# Patient Record
Sex: Female | Born: 1990 | Race: White | Hispanic: No | Marital: Single | Smoking: Never smoker
Health system: Southern US, Community
[De-identification: ages and names within clinical notes are randomized; demographics above are authoritative.]

## PROBLEM LIST (undated history)

## (undated) DIAGNOSIS — D259 Leiomyoma of uterus, unspecified: Secondary | ICD-10-CM

## (undated) DIAGNOSIS — N946 Dysmenorrhea, unspecified: Secondary | ICD-10-CM

## (undated) DIAGNOSIS — F419 Anxiety disorder, unspecified: Secondary | ICD-10-CM

## (undated) DIAGNOSIS — N939 Abnormal uterine and vaginal bleeding, unspecified: Secondary | ICD-10-CM

## (undated) HISTORY — DX: Anxiety disorder, unspecified: F41.9

## (undated) HISTORY — DX: Dysmenorrhea, unspecified: N94.6

---
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
## 2011-02-02 NOTE — Assessment & Plan Note (Signed)
NAME:  Kelli Hoffman, EXTON NO.:  [id number]   MEDICAL RECORD NO.:  [id number]          PATIENT TYPE:  POB   LOCATION:  [facility]         FACILITY:  [facility]   PHYSICIAN:  Argentina Donovan, MD        DATE OF BIRTH:  1991-08-03   DATE OF SERVICE:  07/10/2008                                  CLINIC NOTE   The patient is a 20 year old virginal Caucasian female who came in with  her mother because of severe dysmenorrhea, periods are slightly  irregular from 20-32 days and on her heaviest day, she uses sometimes up  to 10 pads.  She has had dysmenorrhea since the onset of her periods at  age 17, but has increased significantly over the past year.  The  description of her periods seem to be crampy rather than steady pain and  seemed to be indicated a non-pathological dysmenorrhea.  The patient is  5 feet 5 inches tall, weighs 113 pounds.  She has normal blood pressure  130/70 and her pulse is 106.  We talked to the patient about  alternatives.  We have decided we can put her on Seasonique and get an  ultrasound to rule out any significant pathology.   IMPRESSION:  Dysmenorrhea.            ______________________________  Argentina Donovan, MD     PR/MEDQ  D:  07/10/2008  T:  07/10/2008  Job:  [id number]

## 2011-04-06 ENCOUNTER — Ambulatory Visit (INDEPENDENT_AMBULATORY_CARE_PROVIDER_SITE_OTHER): Payer: BC Managed Care – PPO | Admitting: Family Medicine

## 2011-04-06 ENCOUNTER — Encounter: Payer: Self-pay | Admitting: Family Medicine

## 2011-04-06 VITALS — BP 100/80 | HR 80 | Temp 98.2°F | Wt 119.0 lb

## 2011-04-06 DIAGNOSIS — L239 Allergic contact dermatitis, unspecified cause: Secondary | ICD-10-CM | POA: Insufficient documentation

## 2011-04-06 DIAGNOSIS — L259 Unspecified contact dermatitis, unspecified cause: Secondary | ICD-10-CM

## 2011-04-06 MED ORDER — FLUOCINONIDE 0.05 % EX CREA: TOPICAL_CREAM | CUTANEOUS | Status: DC

## 2011-04-06 NOTE — Progress Notes (Signed)
  Subjective:    Patient ID: Kelli Hoffman, female    DOB: Aug 18, 1991, 20 y.o.   MRN: [id number]  HPI Rash Patient presents for evaluation of a rash involving the trunk. Rash started 5 days ago. Lesions are pink, and raised in texture. Rash has changed over time. Rash is pruritic. Associated symptoms: none. Patient denies: abdominal pain, arthralgia, cough, decrease in appetite, fever, headache, myalgia, sore throat and vomiting. Patient has not had contacts with similar rash. Patient has not had new exposures (soaps, lotions, laundry detergents, foods, medications, plants, insects or animals).  Has tried OTC 1% hydrocortisone cream with no relief of symptoms.  Review of Systems See HPI    Objective:   Physical Exam BP 100/80  Pulse 80  Temp(Src) 98.2 F (36.8 C) (Oral)  Wt 119 lb (53.978 kg)  LMP 03/17/2011 Gen:  Alert, pleasant, NAD Skin:  Raised, erythematous, confluent rash on abdomen and back.       Assessment & Plan:   1. Allergic dermatitis   New. Pt unaware of any changes in soaps and or detergents. She will ask her mom if there have been any changes. Lidex to area, benadryl or zyrtec twice daily The patient indicates understanding of these issues and agrees with the plan.

## 2011-04-06 NOTE — Patient Instructions (Signed)
Good to meet you. Please ask your mom if she is using new detergents or soaps in the house.  Take Benadryl or Zyrtec twice daily (as written on bottle). Lidex cream once or twice daily. Call us at end of the week with an update.

## 2011-06-02 ENCOUNTER — Other Ambulatory Visit: Payer: Self-pay | Admitting: Obstetrics and Gynecology

## 2011-06-27 ENCOUNTER — Other Ambulatory Visit: Payer: Self-pay | Admitting: Obstetrics and Gynecology

## 2011-08-10 ENCOUNTER — Encounter: Payer: Self-pay | Admitting: Family Medicine

## 2011-08-10 ENCOUNTER — Ambulatory Visit (INDEPENDENT_AMBULATORY_CARE_PROVIDER_SITE_OTHER): Payer: BC Managed Care – PPO | Admitting: Family Medicine

## 2011-08-10 VITALS — BP 116/78 | HR 84 | Temp 98.6°F | Wt 119.2 lb

## 2011-08-10 DIAGNOSIS — L239 Allergic contact dermatitis, unspecified cause: Secondary | ICD-10-CM

## 2011-08-10 DIAGNOSIS — L259 Unspecified contact dermatitis, unspecified cause: Secondary | ICD-10-CM

## 2011-08-10 MED ORDER — TRIAMCINOLONE ACETONIDE 0.1 % EX CREA: TOPICAL_CREAM | Freq: Two times a day (BID) | CUTANEOUS | Status: DC

## 2011-08-10 NOTE — Progress Notes (Signed)
  Subjective:    Patient ID: Kelli Hoffman, female    DOB: Oct 14, 1990, 20 y.o.   MRN: [id number]  HPI CC: rash on arms  Bilateral forearms and some into thumb.  Started noticing 1 wk ago.  Occasionally itchy.    No new lotions, soaps, shampoos, detergents, creams, foods, meds.  H/o rash on abd over summer, treated with lidex.  No one else at home with rash.  No fevers/chills, nausea, oral lesions.  Review of Systems Per HPI    Objective:   Physical Exam AFBSS NAD Bilateral forearms with faint mild papular skin colored rash, slightly pruritic.  Some on lateral thumbs as well.  None in wrist crease or interdigital web spaces.  Spares palms.  None on trunk, face, legs, back.    Assessment & Plan:

## 2011-08-10 NOTE — Patient Instructions (Signed)
Looks like allergic dermatitis. Keep eye out for anything that you may be exposed to. Treat with triamcinolone cream twice daily for 1 week Update [place] if not improving as expected.

## 2011-08-10 NOTE — Assessment & Plan Note (Signed)
Anticipate allergic dermatitis vs keratosis pilaris although atypical in that spares upper arms. Treat with TCI cream. Update [place] if not improving.

## 2011-10-04 ENCOUNTER — Ambulatory Visit (INDEPENDENT_AMBULATORY_CARE_PROVIDER_SITE_OTHER): Payer: BC Managed Care – PPO | Admitting: Internal Medicine

## 2011-10-04 ENCOUNTER — Encounter: Payer: Self-pay | Admitting: Internal Medicine

## 2011-10-04 DIAGNOSIS — R221 Localized swelling, mass and lump, neck: Secondary | ICD-10-CM

## 2011-10-04 NOTE — Assessment & Plan Note (Signed)
Sounds like this is normal sized, non inflamed node that pops out from under muscle at times No evidence of cyst  Discussed features to expect if secondary infection Observation only for now

## 2011-10-04 NOTE — Progress Notes (Signed)
  Subjective:    Patient ID: Kelli Hoffman, female    DOB: [birth year], 21 y.o.   MRN: [id number]  HPI Noticed a lump on the right back of her neck Present for about a week No pain No change over that time  Not sick  No URI symptoms No sore throat No head sores  Current Outpatient Prescriptions on File Prior to Visit  Medication Sig Dispense Refill  . LOESTRIN 24 FE 1-20 MG-MCG tablet TAKE 1 TABLET BY MOUTH EVERY DAY  28 tablet  0    Allergies  Allergen Reactions  . Penicillins     REACTION: Family allergy to Penicillin.  Patient has never had a problem but tries to avoid it.    Past Medical History  Diagnosis Date  . Anxiety     as a child  . Dysmenorrhea     No past surgical history on file.  Family History  Problem Relation Age of Onset  . Heart disease Mother   . Heart disease Father   . Cancer Maternal Grandmother     lung, smoker  . Diabetes Other     History   Social History  . Marital Status: Single    Spouse Name: N/A    Number of Children: N/A  . Years of Education: N/A   Occupational History  . Not on file.   Social History Main Topics  . Smoking status: Never Smoker   . Smokeless tobacco: Never Used  . Alcohol Use: No  . Drug Use: No  . Sexually Active: Not on file   Other Topics Concern  . Not on file   Social History Narrative  . No narrative on file   Review of Systems Feels well Weight stable    Objective:   Physical Exam  Constitutional: She appears well-developed and well-nourished.  HENT:  Mouth/Throat: Oropharynx is clear and moist. No oropharyngeal exudate.       TMs normal  Neck: Normal range of motion. Neck supple.       No masses noted now  Lymphadenopathy:       Head (right side): No submental, no submandibular, no preauricular, no posterior auricular and no occipital adenopathy present.       Head (left side): No submental, no submandibular, no preauricular, no posterior auricular and no occipital adenopathy  present.    She has no cervical adenopathy.          Assessment & Plan:

## 2011-11-29 ENCOUNTER — Encounter: Payer: Self-pay | Admitting: Family Medicine

## 2011-11-29 ENCOUNTER — Ambulatory Visit (INDEPENDENT_AMBULATORY_CARE_PROVIDER_SITE_OTHER): Payer: BC Managed Care – PPO | Admitting: Family Medicine

## 2011-11-29 VITALS — BP 112/68 | HR 64 | Temp 98.5°F | Wt 118.0 lb

## 2011-11-29 DIAGNOSIS — R809 Proteinuria, unspecified: Secondary | ICD-10-CM

## 2011-11-29 DIAGNOSIS — S335XXA Sprain of ligaments of lumbar spine, initial encounter: Secondary | ICD-10-CM

## 2011-11-29 DIAGNOSIS — M545 Low back pain: Secondary | ICD-10-CM

## 2011-11-29 DIAGNOSIS — S39012A Strain of muscle, fascia and tendon of lower back, initial encounter: Secondary | ICD-10-CM

## 2011-11-29 DIAGNOSIS — R802 Orthostatic proteinuria, unspecified: Secondary | ICD-10-CM | POA: Insufficient documentation

## 2011-11-29 LAB — POCT URINALYSIS DIPSTICK
Leukocytes, UA: NEGATIVE
Protein, UA: 300
Spec Grav, UA: 1.02
pH, UA: 6.5

## 2011-11-29 NOTE — Progress Notes (Signed)
  Subjective:    Patient ID: Kelli Hoffman, female    DOB: 1991/04/12, 21 y.o.   MRN: [id number]  HPI CC: lower back pain  Recently started cosmetology school.  Has been lifting heavy equipment at work.  Has to carry heavy kit to school, but normally on wheels.  Wonders if tweaked back.  Lower back on right side bothering her.  A few days last week as well as over weekend.   Today minimal pain.  Has actually been improving.  So far tried ibuprofen which helped.  No dysuria, urgency or frequency.  No fevers/chills, recently n/v.  No abd pain.  Past Medical History  Diagnosis Date  . Anxiety     as a child  . Dysmenorrhea     Review of Systems Per HPI    Objective:   Physical Exam  Nursing note and vitals reviewed. Constitutional: She appears well-developed and well-nourished. No distress.  Abdominal: There is no CVA tenderness.  Musculoskeletal: Normal range of motion. She exhibits no edema.       No midline spine tenderness. Minimal R paraspinous mm tendenress to palpation at lower lumbar region. Neg SLR, no pain with int/ext rotation of hip.  Neg FABER. No pain at SIJ or GTB bilaterally. FROM at lumbar spine.  Neurological: She is alert.  Skin: Skin is warm and dry. No rash noted.  Psychiatric: She has a normal mood and affect.       Assessment & Plan:

## 2011-11-29 NOTE — Assessment & Plan Note (Signed)
Anticipate lumbar strain. supportive care - ibuprofen/tylenol, heat/ice. Provided with stretching exercises from [facility] pt advisor. Update [place] if not improving as expected. rec lift with knees, not back.

## 2011-11-29 NOTE — Assessment & Plan Note (Addendum)
Checked for LBP to r/u UTI. No evidence of infection, however 3+ protein on UA, micro unremarkable. Will r/o transient proteinuria by asking pt to return later this week or next week for rpt UA. If staying abnl, check for orthostatic proteinuria, if negative further w/u with Cr, [place], and 24 hour urine (or spot urine)

## 2011-11-29 NOTE — Patient Instructions (Signed)
I think you tweaked your back - likely lumbar strain.  Do stretching exercises provided. Ok to take over the counter tylenol or ibuprofen for symptomatic relief. Update [place] if not improving as expected. May also use ice/heat to back. Good to see you today, call us with quesitons.

## 2011-12-06 ENCOUNTER — Telehealth: Payer: Self-pay | Admitting: Internal Medicine

## 2011-12-06 NOTE — Telephone Encounter (Addendum)
Please have her come in to collect urine on a Monday if that is the only day she can do it.Kelli Hoffman

## 2011-12-06 NOTE — Telephone Encounter (Signed)
Patient was seen by Dr. Renee Ramus on 11/29/11 and she was asked to return for a urine specimen because her protein was high.  Her father walked into the office today and said she is in school in [place] on Tues - Sat until 5:30pm, so she can only come in to do that on a Monday.  We do not have nurse visits on Mondays.  Please advise.

## 2011-12-13 NOTE — Telephone Encounter (Signed)
Message left for patient to return my call and advise if she was coming in with urine sample today or next Monday.

## 2011-12-15 NOTE — Telephone Encounter (Signed)
Appt has been scheduled.

## 2011-12-20 ENCOUNTER — Encounter: Payer: Self-pay | Admitting: Family Medicine

## 2011-12-20 ENCOUNTER — Ambulatory Visit (INDEPENDENT_AMBULATORY_CARE_PROVIDER_SITE_OTHER): Payer: BC Managed Care – PPO | Admitting: Family Medicine

## 2011-12-20 VITALS — BP 108/60 | HR 76 | Temp 98.4°F | Wt 118.8 lb

## 2011-12-20 DIAGNOSIS — R809 Proteinuria, unspecified: Secondary | ICD-10-CM

## 2011-12-20 LAB — POCT URINALYSIS DIPSTICK
Spec Grav, UA: 1.01
Urobilinogen, UA: 0.2
pH, UA: 7

## 2011-12-20 NOTE — Assessment & Plan Note (Addendum)
Overall well appearing, no evidence of edema.  Bp, weight normal. Improved proteinuria but persistent. Check for orthostatic with recumbent Upr/cr ratio compared to upright Upr/cr ratio. Discussed how to collect. If orthostatic negative, further w/u including Cr, [place].

## 2011-12-20 NOTE — Patient Instructions (Signed)
You still have some protein in urine.  Lots of different things can cause this.  One cause of this is something called orthostatic proteinuria or protein loss when standing up - which is benign. Next time you are in town: Sunday night urinate right before bedtime then go straight to sleep. First thing on Monday morning when you wake up, collect urine in a cup and bring it in for [place] to test it.  We will also recheck urine on Monday. Good to see you today, call us with questions.

## 2011-12-20 NOTE — Progress Notes (Signed)
  Subjective:    Patient ID: Kelli Hoffman, female    DOB: 11/03/1990, 21 y.o.   MRN: [id number]  HPI CC: f/u proteinuria  Here for f/u proteinuria found when UA collected for LBP (dx as strain) returned with 3+ protein.  Denies SOB, CP, leg swelling or any swelling, coughing, fevers/chills, appt changes, or   LBP has resolved.  Overall feeling well.  Review of Systems Per HPI    Objective:   Physical Exam  Nursing note and vitals reviewed. Constitutional: She appears well-developed and well-nourished. No distress.  Musculoskeletal: She exhibits no edema.  Skin: Skin is warm and dry.  Psychiatric: She has a normal mood and affect.      Assessment & Plan:

## 2012-01-10 ENCOUNTER — Ambulatory Visit: Payer: BC Managed Care – PPO | Admitting: Family Medicine

## 2012-01-10 ENCOUNTER — Ambulatory Visit (INDEPENDENT_AMBULATORY_CARE_PROVIDER_SITE_OTHER): Payer: BC Managed Care – PPO

## 2012-01-10 DIAGNOSIS — R809 Proteinuria, unspecified: Secondary | ICD-10-CM

## 2012-01-11 LAB — PROTEIN / CREATININE RATIO, URINE
Creatinine, Urine: 107.5 mg/dL
Creatinine, Urine: 178.8 mg/dL
Protein Creatinine Ratio: 4.04 — ABNORMAL HIGH (ref <0.15)
Total Protein, Urine: 3 mg/dL

## 2012-03-20 ENCOUNTER — Encounter: Payer: BC Managed Care – PPO | Admitting: Internal Medicine

## 2012-05-16 ENCOUNTER — Encounter: Payer: Self-pay | Admitting: Family Medicine

## 2012-05-16 ENCOUNTER — Ambulatory Visit (INDEPENDENT_AMBULATORY_CARE_PROVIDER_SITE_OTHER): Payer: BC Managed Care – PPO | Admitting: Family Medicine

## 2012-05-16 VITALS — BP 107/59 | HR 70 | Ht 65.0 in | Wt 116.0 lb

## 2012-05-16 DIAGNOSIS — Z7251 High risk heterosexual behavior: Secondary | ICD-10-CM

## 2012-05-16 DIAGNOSIS — IMO0001 Reserved for inherently not codable concepts without codable children: Secondary | ICD-10-CM

## 2012-05-16 DIAGNOSIS — Z309 Encounter for contraceptive management, unspecified: Secondary | ICD-10-CM

## 2012-05-16 DIAGNOSIS — Z0000 Encounter for general adult medical examination without abnormal findings: Secondary | ICD-10-CM

## 2012-05-16 MED ORDER — NORETHIN ACE-ETH ESTRAD-FE 1-20 MG-MCG(24) PO TABS: 1.0000 | ORAL_TABLET | Freq: Every day | ORAL | Status: DC

## 2012-05-16 NOTE — Progress Notes (Signed)
Patient ID: Georgean Spainhower, female   DOB: 1991/03/26, 21 y.o.   MRN: [id number] SUBJECTIVE:  21 y.o. female for annual routine checkup and to refill her birth control pills.  No issues with pills.  Nml cycles and remembers to take them. In cosmetology school.  No issues today. Past Medical History  Diagnosis Date  . Anxiety     as a child  . Dysmenorrhea    History reviewed. No pertinent past surgical history. Family History  Problem Relation Age of Onset  . Heart disease Mother   . Heart disease Father   . Cancer Maternal Grandmother     lung, smoker  . Diabetes Other    History   Social History  . Marital Status: Single    Spouse Name: N/A    Number of Children: N/A  . Years of Education: N/A   Occupational History  . Not on file.   Social History Main Topics  . Smoking status: Never Smoker   . Smokeless tobacco: Never Used  . Alcohol Use: No  . Drug Use: No  . Sexually Active: Yes -- Female partner(s)    Birth Control/ Protection: Pill   Other Topics Concern  . Not on file   Social History Narrative  . No narrative on file    Current Outpatient Prescriptions  Medication Sig Dispense Refill  . LOESTRIN 24 FE 1-20 MG-MCG tablet TAKE 1 TABLET BY MOUTH EVERY DAY  28 tablet  0   Allergies: Penicillins  Patient's last menstrual period was 04/19/2012.  ROS:  Feeling well. No dyspnea or chest pain on exertion.  No abdominal pain, change in bowel habits, black or bloody stools.  No urinary tract symptoms. GYN ROS: normal menses, no abnormal bleeding, pelvic pain or discharge, no breast pain or new or enlarging lumps on self exam. No neurological complaints.  OBJECTIVE:  The patient appears well, alert, oriented x 3, in no distress. BP 107/59  Pulse 70  Ht 5\' 5"  (1.651 m)  Wt 116 lb (52.617 kg)  BMI 19.30 kg/m2  LMP 04/19/2012 ENT normal. PERLA, sclera without icterus  Neck supple. No adenopathy or thyromegaly.  Lungs are clear, good air entry, no wheezes, rhonchi  or rales.  CV with S1 and S2 normal, no murmurs, regular rate and rhythm.  Abdomen soft without tenderness, guarding, mass or organomegaly.  Extremities show no edema, normal peripheral pulses.  Neurological is normal, no focal findings. BREAST EXAM: breasts appear normal, no suspicious masses, no skin or nipple changes or axillary nodes PELVIC EXAM: normal external genitalia, vulva, vagina, cervix, uterus and adnexa  ASSESSMENT:  Well woman-does not need pap as she is only 21. No contraindication to continue hormonal therapy  PLAN:  Return annually or prn Refilled OC's. Check urinary GC/Chlam.  Pt. Declines HIV testing.

## 2012-05-16 NOTE — Patient Instructions (Signed)
Preventive Care for Adults, Female A healthy lifestyle and preventive care can promote health and wellness. Preventive health guidelines for women include the following key practices.  A routine yearly physical is a good way to check with your caregiver about your health and preventive screening. It is a chance to share any concerns and updates on your health, and to receive a thorough exam.   Visit your dentist for a routine exam and preventive care every 6 months. Brush your teeth twice a day and floss once a day. Good oral hygiene prevents tooth decay and gum disease.   The frequency of eye exams is based on your age, health, family medical history, use of contact lenses, and other factors. Follow your caregiver's recommendations for frequency of eye exams.   Eat a healthy diet. Foods like vegetables, fruits, whole grains, low-fat dairy products, and lean protein foods contain the nutrients you need without too many calories. Decrease your intake of foods high in solid fats, added sugars, and salt. Eat the right amount of calories for you.Get information about a proper diet from your caregiver, if necessary.   Regular physical exercise is one of the most important things you can do for your health. Most adults should get at least 150 minutes of moderate-intensity exercise (any activity that increases your heart rate and causes you to sweat) each week. In addition, most adults need muscle-strengthening exercises on 2 or more days a week.   Maintain a healthy weight. The body mass index (BMI) is a screening tool to identify possible weight problems. It provides an estimate of body fat based on height and weight. Your caregiver can help determine your BMI, and can help you achieve or maintain a healthy weight.For adults 20 years and older:   A BMI below 18.5 is considered underweight.   A BMI of 18.5 to 24.9 is normal.   A BMI of 25 to 29.9 is considered overweight.   A BMI of 30 and above is  considered obese.   Maintain normal blood lipids and cholesterol levels by exercising and minimizing your intake of saturated fat. Eat a balanced diet with plenty of fruit and vegetables. Blood tests for lipids and cholesterol should begin at age 20 and be repeated every 5 years. If your lipid or cholesterol levels are high, you are over 50, or you are at high risk for heart disease, you may need your cholesterol levels checked more frequently.Ongoing high lipid and cholesterol levels should be treated with medicines if diet and exercise are not effective.   If you smoke, find out from your caregiver how to quit. If you do not use tobacco, do not start.   If you are pregnant, do not drink alcohol. If you are breastfeeding, be very cautious about drinking alcohol. If you are not pregnant and choose to drink alcohol, do not exceed 1 drink per day. One drink is considered to be 12 ounces (355 mL) of beer, 5 ounces (148 mL) of wine, or 1.5 ounces (44 mL) of liquor.   Avoid use of street drugs. Do not share needles with anyone. Ask for help if you need support or instructions about stopping the use of drugs.   High blood pressure causes heart disease and increases the risk of stroke. Your blood pressure should be checked at least every 1 to 2 years. Ongoing high blood pressure should be treated with medicines if weight loss and exercise are not effective.   If you are 55 to 20   years old, ask your caregiver if you should take aspirin to prevent strokes.   Diabetes screening involves taking a blood sample to check your fasting blood sugar level. This should be done once every 3 years, after age 45, if you are within normal weight and without risk factors for diabetes. Testing should be considered at a younger age or be carried out more frequently if you are overweight and have at least 1 risk factor for diabetes.   Breast cancer screening is essential preventive care for women. You should practice "breast  self-awareness." This means understanding the normal appearance and feel of your breasts and may include breast self-examination. Any changes detected, no matter how small, should be reported to a caregiver. Women in their 20s and 30s should have a clinical breast exam (CBE) by a caregiver as part of a regular health exam every 1 to 3 years. After age 40, women should have a CBE every year. Starting at age 40, women should consider having a mammography (breast X-ray test) every year. Women who have a family history of breast cancer should talk to their caregiver about genetic screening. Women at a high risk of breast cancer should talk to their caregivers about having magnetic resonance imaging (MRI) and a mammography every year.   The Pap test is a screening test for cervical cancer. A Pap test can show cell changes on the cervix that might become cervical cancer if left untreated. A Pap test is a procedure in which cells are obtained and examined from the lower end of the uterus (cervix).   Women should have a Pap test starting at age 21.   Between ages 21 and 29, Pap tests should be repeated every 2 years.   Beginning at age 30, you should have a Pap test every 3 years as long as the past 3 Pap tests have been normal.   Some women have medical problems that increase the chance of getting cervical cancer. Talk to your caregiver about these problems. It is especially important to talk to your caregiver if a new problem develops soon after your last Pap test. In these cases, your caregiver may recommend more frequent screening and Pap tests.   The above recommendations are the same for women who have or have not gotten the vaccine for human papillomavirus (HPV).   If you had a hysterectomy for a problem that was not cancer or a condition that could lead to cancer, then you no longer need Pap tests. Even if you no longer need a Pap test, a regular exam is a good idea to make sure no other problems are  starting.   If you are between ages 65 and 70, and you have had normal Pap tests going back 10 years, you no longer need Pap tests. Even if you no longer need a Pap test, a regular exam is a good idea to make sure no other problems are starting.   If you have had past treatment for cervical cancer or a condition that could lead to cancer, you need Pap tests and screening for cancer for at least 20 years after your treatment.   If Pap tests have been discontinued, risk factors (such as a new sexual partner) need to be reassessed to determine if screening should be resumed.   The HPV test is an additional test that may be used for cervical cancer screening. The HPV test looks for the virus that can cause the cell changes on the cervix.   The cells collected during the Pap test can be tested for HPV. The HPV test could be used to screen women aged 30 years and older, and should be used in women of any age who have unclear Pap test results. After the age of 30, women should have HPV testing at the same frequency as a Pap test.   Colorectal cancer can be detected and often prevented. Most routine colorectal cancer screening begins at the age of 50 and continues through age 75. However, your caregiver may recommend screening at an earlier age if you have risk factors for colon cancer. On a yearly basis, your caregiver may provide home test kits to check for hidden blood in the stool. Use of a small camera at the end of a tube, to directly examine the colon (sigmoidoscopy or colonoscopy), can detect the earliest forms of colorectal cancer. Talk to your caregiver about this at age 50, when routine screening begins. Direct examination of the colon should be repeated every 5 to 10 years through age 75, unless early forms of pre-cancerous polyps or small growths are found.   Hepatitis C blood testing is recommended for all people born from 1945 through 1965 and any individual with known risks for hepatitis C.    Practice safe sex. Use condoms and avoid high-risk sexual practices to reduce the spread of sexually transmitted infections (STIs). STIs include gonorrhea, chlamydia, syphilis, trichomonas, herpes, HPV, and human immunodeficiency virus (HIV). Herpes, HIV, and HPV are viral illnesses that have no cure. They can result in disability, cancer, and death. Sexually active women aged 25 and younger should be checked for chlamydia. Older women with new or multiple partners should also be tested for chlamydia. Testing for other STIs is recommended if you are sexually active and at increased risk.   Osteoporosis is a disease in which the bones lose minerals and strength with aging. This can result in serious bone fractures. The risk of osteoporosis can be identified using a bone density scan. Women ages 65 and over and women at risk for fractures or osteoporosis should discuss screening with their caregivers. Ask your caregiver whether you should take a calcium supplement or vitamin D to reduce the rate of osteoporosis.   Menopause can be associated with physical symptoms and risks. Hormone replacement therapy is available to decrease symptoms and risks. You should talk to your caregiver about whether hormone replacement therapy is right for you.   Use sunscreen with sun protection factor (SPF) of 30 or more. Apply sunscreen liberally and repeatedly throughout the day. You should seek shade when your shadow is shorter than you. Protect yourself by wearing long sleeves, pants, a wide-brimmed hat, and sunglasses year round, whenever you are outdoors.   Once a month, do a whole body skin exam, using a mirror to look at the skin on your back. Notify your caregiver of new moles, moles that have irregular borders, moles that are larger than a pencil eraser, or moles that have changed in shape or color.   Stay current with required immunizations.   Influenza. You need a dose every fall (or winter). The composition of  the flu vaccine changes each year, so being vaccinated once is not enough.   Pneumococcal polysaccharide. You need 1 to 2 doses if you smoke cigarettes or if you have certain chronic medical conditions. You need 1 dose at age 65 (or older) if you have never been vaccinated.   Tetanus, diphtheria, pertussis (Tdap, Td). Get 1 dose of   Tdap vaccine if you are younger than age 65, are over 65 and have contact with an infant, are a healthcare worker, are pregnant, or simply want to be protected from whooping cough. After that, you need a Td booster dose every 10 years. Consult your caregiver if you have not had at least 3 tetanus and diphtheria-containing shots sometime in your life or have a deep or dirty wound.   HPV. You need this vaccine if you are a woman age 26 or younger. The vaccine is given in 3 doses over 6 months.   Measles, mumps, rubella (MMR). You need at least 1 dose of MMR if you were born in 1957 or later. You may also need a second dose.   Meningococcal. If you are age 19 to 21 and a first-year college student living in a residence hall, or have one of several medical conditions, you need to get vaccinated against meningococcal disease. You may also need additional booster doses.   Zoster (shingles). If you are age 60 or older, you should get this vaccine.   Varicella (chickenpox). If you have never had chickenpox or you were vaccinated but received only 1 dose, talk to your caregiver to find out if you need this vaccine.   Hepatitis A. You need this vaccine if you have a specific risk factor for hepatitis A virus infection or you simply wish to be protected from this disease. The vaccine is usually given as 2 doses, 6 to 18 months apart.   Hepatitis B. You need this vaccine if you have a specific risk factor for hepatitis B virus infection or you simply wish to be protected from this disease. The vaccine is given in 3 doses, usually over 6 months.  Preventive Services /  Frequency Ages 19 to 39  Blood pressure check.** / Every 1 to 2 years.   Lipid and cholesterol check.** / Every 5 years beginning at age 20.   Clinical breast exam.** / Every 3 years for women in their 20s and 30s.   Pap test.** / Every 2 years from ages 21 through 29. Every 3 years starting at age 30 through age 65 or 70 with a history of 3 consecutive normal Pap tests.   HPV screening.** / Every 3 years from ages 30 through ages 65 to 70 with a history of 3 consecutive normal Pap tests.   Hepatitis C blood test.** / For any individual with known risks for hepatitis C.   Skin self-exam. / Monthly.   Influenza immunization.** / Every year.   Pneumococcal polysaccharide immunization.** / 1 to 2 doses if you smoke cigarettes or if you have certain chronic medical conditions.   Tetanus, diphtheria, pertussis (Tdap, Td) immunization. / A one-time dose of Tdap vaccine. After that, you need a Td booster dose every 10 years.   HPV immunization. / 3 doses over 6 months, if you are 26 and younger.   Measles, mumps, rubella (MMR) immunization. / You need at least 1 dose of MMR if you were born in 1957 or later. You may also need a second dose.   Meningococcal immunization. / 1 dose if you are age 19 to 21 and a first-year college student living in a residence hall, or have one of several medical conditions, you need to get vaccinated against meningococcal disease. You may also need additional booster doses.   Varicella immunization.** / Consult your caregiver.   Hepatitis A immunization.** / Consult your caregiver. 2 doses, 6 to 18 months   apart.   Hepatitis B immunization.** / Consult your caregiver. 3 doses usually over 6 months.  Ages 40 to 64  Blood pressure check.** / Every 1 to 2 years.   Lipid and cholesterol check.** / Every 5 years beginning at age 20.   Clinical breast exam.** / Every year after age 40.   Mammogram.** / Every year beginning at age 40 and continuing for as  long as you are in good health. Consult with your caregiver.   Pap test.** / Every 3 years starting at age 30 through age 65 or 70 with a history of 3 consecutive normal Pap tests.   HPV screening.** / Every 3 years from ages 30 through ages 65 to 70 with a history of 3 consecutive normal Pap tests.   Fecal occult blood test (FOBT) of stool. / Every year beginning at age 50 and continuing until age 75. You may not need to do this test if you get a colonoscopy every 10 years.   Flexible sigmoidoscopy or colonoscopy.** / Every 5 years for a flexible sigmoidoscopy or every 10 years for a colonoscopy beginning at age 50 and continuing until age 75.   Hepatitis C blood test.** / For all people born from 1945 through 1965 and any individual with known risks for hepatitis C.   Skin self-exam. / Monthly.   Influenza immunization.** / Every year.   Pneumococcal polysaccharide immunization.** / 1 to 2 doses if you smoke cigarettes or if you have certain chronic medical conditions.   Tetanus, diphtheria, pertussis (Tdap, Td) immunization.** / A one-time dose of Tdap vaccine. After that, you need a Td booster dose every 10 years.   Measles, mumps, rubella (MMR) immunization. / You need at least 1 dose of MMR if you were born in 1957 or later. You may also need a second dose.   Varicella immunization.** / Consult your caregiver.   Meningococcal immunization.** / Consult your caregiver.   Hepatitis A immunization.** / Consult your caregiver. 2 doses, 6 to 18 months apart.   Hepatitis B immunization.** / Consult your caregiver. 3 doses, usually over 6 months.  Ages 65 and over  Blood pressure check.** / Every 1 to 2 years.   Lipid and cholesterol check.** / Every 5 years beginning at age 20.   Clinical breast exam.** / Every year after age 40.   Mammogram.** / Every year beginning at age 40 and continuing for as long as you are in good health. Consult with your caregiver.   Pap test.** /  Every 3 years starting at age 30 through age 65 or 70 with a 3 consecutive normal Pap tests. Testing can be stopped between 65 and 70 with 3 consecutive normal Pap tests and no abnormal Pap or HPV tests in the past 10 years.   HPV screening.** / Every 3 years from ages 30 through ages 65 or 70 with a history of 3 consecutive normal Pap tests. Testing can be stopped between 65 and 70 with 3 consecutive normal Pap tests and no abnormal Pap or HPV tests in the past 10 years.   Fecal occult blood test (FOBT) of stool. / Every year beginning at age 50 and continuing until age 75. You may not need to do this test if you get a colonoscopy every 10 years.   Flexible sigmoidoscopy or colonoscopy.** / Every 5 years for a flexible sigmoidoscopy or every 10 years for a colonoscopy beginning at age 50 and continuing until age 75.   Hepatitis   C blood test.** / For all people born from [birth year] through 1965 and any individual with known risks for hepatitis C.   Osteoporosis screening.** / A one-time screening for women ages 70 and over and women at risk for fractures or osteoporosis.   Skin self-exam. / Monthly.   Influenza immunization.** / Every year.   Pneumococcal polysaccharide immunization.** / 1 dose at age 75 (or older) if you have never been vaccinated.   Tetanus, diphtheria, pertussis (Tdap, Td) immunization. / A one-time dose of Tdap vaccine if you are over 65 and have contact with an infant, are a Research scientist (physical sciences), or simply want to be protected from whooping cough. After that, you need a Td booster dose every 10 years.   Varicella immunization.** / Consult your caregiver.   Meningococcal immunization.** / Consult your caregiver.   Hepatitis A immunization.** / Consult your caregiver. 2 doses, 6 to 18 months apart.   Hepatitis B immunization.** / Check with your caregiver. 3 doses, usually over 6 months.  ** Family history and personal history of risk and conditions may change your caregiver's  recommendations. Document Released: 11/02/2001 Document Revised: 08/26/2011 Document Reviewed: 02/01/2011 [facility] Patient Information 2012 [place], [place].Oral Contraception Information Oral contraceptives (OCs) are medicines taken to prevent pregnancy. OCs work by preventing the ovaries from releasing eggs. The hormones in OCs also cause the cervical mucus to thicken, preventing the sperm from entering the uterus. The hormones also cause the uterine lining to become thin, not allowing a fertilized egg to attach to the inside of the uterus. OCs are highly effective when taken exactly as prescribed. However, OCs do not prevent sexually transmitted diseases (STDs). Safe sex practices, such as using condoms along with the pill, can help prevent STDs.  Before taking the pill, you may have a physical exam and Pap test. Your caregiver may order blood tests that may be necessary. Your caregiver will make sure you are a good candidate for oral contraception. Discuss with your caregiver the possible side effects of the OC you may be prescribed. When starting an OC, it can take 2 to 3 months for the body to adjust to the changes in hormone levels in your body.  TYPES OF ORAL CONTRACEPTION  The combination pill. This pill contains estrogen and progestin (synthetic progesterone) hormones. The combination pill comes in either 21-day or 28-day packs. With 21-day packs, you do not take pills for 7 days after the last pill. With 28-day packs, the pill is taken every day. The last 7 pills are without hormones. Certain types of pills have more than 21 hormone-containing pills.   The minipill. This pill contains the progesterone hormone only. It is taken every day continuously. The minipill comes in packs of 91 pills. The first 84 pills contain the hormones, and the last 7 pills do not. The last 7 days are when you will have your menstrual period. You may experience irregular spotting.  ADVANTAGES  Decreases premenstrual  symptoms.   Treats menstrual period cramps.   Regulates the menstrual cycle.   Decreases a heavy menstrual flow.   Treats acne.   Treats abnormal uterine bleeding.   Treats chronic pelvic pain.   Treats polycystic ovarian syndrome.   Treats endometriosis.   Can be used as emergency contraception.  DISADVANTAGES OCs can be less effective if:  You forget to take the pill at the same time every day.   You have a stomach or intestinal disease that lessens the absorption of the pill.  You take OCs with other medicines that make OCs less effective.   You take expired OCs.   You forget to restart the pill on day 7, when using the packs of 21 pills.  Document Released: 11/27/2002 Document Revised: 08/26/2011 Document Reviewed: 01/13/2011 [facility] Patient Information 2012 [place], [place].

## 2012-05-17 LAB — GC/CHLAMYDIA PROBE AMP, URINE: GC Probe Amp, Urine: NEGATIVE

## 2012-05-17 NOTE — Progress Notes (Signed)
Message left on patient voicemail at home to called office.

## 2012-06-19 ENCOUNTER — Encounter: Payer: Self-pay | Admitting: Internal Medicine

## 2012-06-19 ENCOUNTER — Ambulatory Visit (INDEPENDENT_AMBULATORY_CARE_PROVIDER_SITE_OTHER): Payer: BC Managed Care – PPO | Admitting: Internal Medicine

## 2012-06-19 VITALS — BP 110/70 | HR 67 | Temp 97.7°F | Wt 112.0 lb

## 2012-06-19 DIAGNOSIS — M545 Low back pain, unspecified: Secondary | ICD-10-CM

## 2012-06-19 NOTE — Progress Notes (Signed)
  Subjective:    Patient ID: Kelli Hoffman, female    DOB: May 03, 1991, 21 y.o.   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 Norethindrone Acetate-Ethinyl Estrad-FE (LOESTRIN 24 FE) 1-20 MG-MCG(24) tablet Take 1 tablet by mouth daily.  28 tablet  11    Allergies  Allergen Reactions  . Penicillins     REACTION: Family allergy to Penicillin.  Patient has never had a problem but tries to avoid it.    Past Medical History  Diagnosis Date  . Anxiety     as a child  . Dysmenorrhea     No past surgical history on file.  Family History  Problem Relation Age of Onset  . Heart disease Mother   . Heart disease Father   . Cancer Maternal Grandmother     lung, smoker  . Diabetes Other     History   Social History  . Marital Status: Single    Spouse Name: N/A    Number of Children: N/A  . Years of Education: N/A   Occupational History  . Government social research officer in [place]   Social History Main Topics  . Smoking status: Never Smoker   . Smokeless tobacco: Never Used  . Alcohol Use: No  . Drug Use: No  . Sexually Active: Yes -- Female partner(s)    Birth Control/ Protection: Pill   Other Topics Concern  . Not on file   Social History Narrative  . No narrative on file   Review of Systems No voiding or bowel habits No fever  Doesn't feel sick    Objective:   Physical Exam  Constitutional: She appears well-developed and well-nourished. No distress.  Musculoskeletal:   No spine pain Slight tenderness bilaterally along S-I areas Flexion to 75 degrees--then pain SLR negative bilaterally  Neurological: She exhibits normal muscle tone. Coordination normal.       Normal gait and strength          Assessment & Plan:

## 2012-06-19 NOTE — Assessment & Plan Note (Signed)
Seems to be just muscular  No worrisome neuro features Reassured Discussed walking ?heat, ibuprofen F/u only if worrisome features---discussed

## 2012-10-13 ENCOUNTER — Ambulatory Visit (INDEPENDENT_AMBULATORY_CARE_PROVIDER_SITE_OTHER): Payer: BC Managed Care – PPO | Admitting: Internal Medicine

## 2012-10-13 ENCOUNTER — Encounter: Payer: Self-pay | Admitting: Internal Medicine

## 2012-10-13 VITALS — BP 102/70 | HR 88 | Temp 98.2°F | Wt 112.0 lb

## 2012-10-13 DIAGNOSIS — J111 Influenza due to unidentified influenza virus with other respiratory manifestations: Secondary | ICD-10-CM

## 2012-10-13 NOTE — Progress Notes (Signed)
  Subjective:    Patient ID: Kelli Hoffman, female    DOB: 11-03-1990, 22 y.o.   MRN: [id number]  HPI Woke yesterday "like I was dying" Felt like she "got hit by a truck" Some cough, chills, aching (yesterday) Slept all day yesterday No known fever  Some rhinorrhea No SOB Cough is dry No ear pain  Lots of people at school with similar symptoms  Current Outpatient Prescriptions on File Prior to Visit  Medication Sig Dispense Refill  . Norethindrone Acetate-Ethinyl Estrad-FE (LOESTRIN 24 FE) 1-20 MG-MCG(24) tablet Take 1 tablet by mouth daily.  28 tablet  11    Allergies  Allergen Reactions  . Penicillins     REACTION: Family allergy to Penicillin.  Patient has never had a problem but tries to avoid it.    Past Medical History  Diagnosis Date  . Anxiety     as a child  . Dysmenorrhea     No past surgical history on file.  Family History  Problem Relation Age of Onset  . Heart disease Mother   . Heart disease Father   . Cancer Maternal Grandmother     lung, smoker  . Diabetes Other     History   Social History  . Marital Status: Single    Spouse Name: N/A    Number of Children: N/A  . Years of Education: N/A   Occupational History  . Government social research officer in [place]   Social History Main Topics  . Smoking status: Never Smoker   . Smokeless tobacco: Never Used  . Alcohol Use: No  . Drug Use: No  . Sexually Active: Yes -- Female partner(s)    Birth Control/ Protection: Pill   Other Topics Concern  . Not on file   Social History Narrative  . No narrative on file   Review of Systems No rash No abdominal pain No vomiting or diarrhea Able to eat    Objective:   Physical Exam  Constitutional: She appears well-developed and well-nourished. No distress.  HENT:       No sinus tenderness Tonsils 1-2+. White spot on right but not inflamed at all Mild nasal inflammation TMs normal  Neck: Normal range of motion. Neck supple.  Pulmonary/Chest:  Effort normal and breath sounds normal. No respiratory distress. She has no wheezes. She has no rales.  Lymphadenopathy:    She has no cervical adenopathy.  Skin: No rash noted.          Assessment & Plan:

## 2012-10-13 NOTE — Assessment & Plan Note (Signed)
Fairly classic presentation though probably mild since looks and feels considerably better today Discussed tamiflu--not really indicated Reviewed supportive care

## 2013-06-04 ENCOUNTER — Other Ambulatory Visit: Payer: Self-pay | Admitting: Family Medicine

## 2013-11-08 ENCOUNTER — Ambulatory Visit (INDEPENDENT_AMBULATORY_CARE_PROVIDER_SITE_OTHER): Payer: BC Managed Care – PPO | Admitting: Family Medicine

## 2013-11-08 ENCOUNTER — Encounter: Payer: Self-pay | Admitting: Family Medicine

## 2013-11-08 VITALS — BP 100/60 | HR 104 | Temp 98.4°F | Ht 65.0 in | Wt 130.5 lb

## 2013-11-08 DIAGNOSIS — J069 Acute upper respiratory infection, unspecified: Secondary | ICD-10-CM

## 2013-11-08 NOTE — Progress Notes (Signed)
Pre visit review using our clinic review tool, if applicable. No additional management support is needed unless otherwise documented below in the visit note. 

## 2013-11-08 NOTE — Progress Notes (Signed)
Patient Name: Kelli Hoffman Date of Birth: [birth year] Medical Record Number: [id number]  History of Present Illness:  Patent presents with runny nose, sneezing, cough, sore throat, malaise and minimal / low-grade fever .  Last week, she did not have a jacket and her throat has been bothering her and was congested and coughing.   Sick for about a week.  ? recent exposure to others with similar symptoms.   The patent denies sore throat as the primary complaint. Denies sthortness of breath/wheezing, high fever, chest pain, rhinits for more than 14 days, significant myalgia, otalgia, facial pain, abdominal pain, changes in bowel or bladder.  PMH, PHS, Allergies, Problem List, Medications, Family History, and Social History have all been reviewed.  Patient Active Problem List   Diagnosis Date Noted  . Influenza 10/13/2012  . 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 Anxiety     as a child  . Dysmenorrhea     No past surgical history on file.  History   Social History  . Marital Status: Single    Spouse Name: N/A    Number of Children: N/A  . 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 Smoking status: Never Smoker   . Smokeless tobacco: Never Used  . Alcohol Use: No  . Drug Use: No  . Sexual Activity: Yes    Partners: Male    Birth Control/ Protection: Pill   Other Topics Concern  . Not on file   Social History Narrative  . No narrative on file    Family History  Problem Relation Age of Onset  . Heart disease Mother   . Heart disease Father   . Cancer Maternal Grandmother     lung, smoker  . Diabetes Other     Allergies  Allergen Reactions  . Penicillins     REACTION: Family allergy to Penicillin.  Patient has never had a problem but tries to avoid it.    Medication list reviewed and updated in full in  [place].  Review of Systems: as above, eating and drinking - tolerating PO. Urinating normally. No excessive vomitting or diarrhea. O/w as above.  Physical Exam:  Filed Vitals:   11/08/13 1729  BP: 100/60  Pulse: 104  Temp: 98.4 F (36.9 C)  TempSrc: Oral  Height: 5\' 5"  (1.651 m)  Weight: 130 lb 8 oz (59.194 kg)    GEN: WDWN, Non-toxic, Atraumatic, normocephalic. A and O x 3. HEENT: Oropharynx clear without exudate, MMM, no significant LAD, mild rhinnorhea Ears: TM clear, COL visualized with good landmarks CV: RRR, no m/g/r. Pulm: CTA B, no wheezes, rhonchi, or crackles, normal respiratory effort. EXT: no c/c/e Psych: well oriented, neither depressed nor anxious in appearance  A/P: 1. URI. Supportive care reviewed with patient. See patient instruction section.  Patient Instructions: Upper Respiratory Infection -Viral Infections  TREATMENT THAT HELPS WITH SYMPTOMS: 1. Drink plenty of fluids, but limit caffeine  2. Decongestant: for congested noses, sinuses, and ear tubes. Pressure release and help drainage: Sudafed (pseudephedrine or Phenylephrine) (NOT IF YOU HAVE HIGH BLOOD PRESSURE)  3. Nasal Sprays: Relieve pressure, promote drainage, open nasal and ear passages. Afrin can be used for only 3-4 days in row.  4. Cough Suppressants: Delsym is an example of a cough suppressant. It lasts for 12 hours  6.  Expectorants: Liquify secretions and improve drainage  Take Guaifenesin (400mg ), take 11/2 tabs by mouth AM and NOON. This is a higher dose than what the box says, but that is ok. It is safe and it liquifies the mucous better at this dose.  Get GUAIFENESIN by  going to [facility] [place], [place] or [facility] and getting MUCOUS RELIEF EXPECTORANT/CONGESTION. DO NOT GET MUCINEX (Timed Release Guaifenesin)   THESE WILL MAKE YOU FEEL BETTER FOR A WHILE, SO YOU CAN DEAL WITH YOUR SYMPTOMS. YOUR BODY HAS TO HEAL ITSELF.  ANTIBIOTICS DO NOT HELP IF YOU HAVE A VIRUS OR  BAD COLD.  Antibiotics kill bacteria not viruses.  Bad viruses can make you feel just as bad or worse than bacterial infections (Like the flu - it is a virus)   Signed,  Godfrey Tritschler T. Jakeline Dave, MD, [place] at [facility] [place] [address] Phone: [phone] Fax: [phone]

## 2014-04-05 ENCOUNTER — Other Ambulatory Visit: Payer: Self-pay | Admitting: Family Medicine

## 2014-07-04 ENCOUNTER — Telehealth: Payer: Self-pay | Admitting: *Deleted

## 2014-07-04 MED ORDER — NORETHIN-ETH ESTRAD-FE BIPHAS 1 MG-10 MCG / 10 MCG PO TABS: ORAL_TABLET | ORAL | Status: DC

## 2014-07-04 NOTE — Telephone Encounter (Signed)
Authorized refills for Lo estrin for one year.

## 2014-07-08 ENCOUNTER — Telehealth: Payer: Self-pay | Admitting: *Deleted

## 2014-07-08 MED ORDER — NORETHIN-ETH ESTRAD-FE BIPHAS 1 MG-10 MCG / 10 MCG PO TABS: ORAL_TABLET | ORAL | Status: DC

## 2014-07-08 NOTE — Telephone Encounter (Signed)
Pharmacy is requesting refill of ocp.

## 2014-07-30 ENCOUNTER — Ambulatory Visit (INDEPENDENT_AMBULATORY_CARE_PROVIDER_SITE_OTHER): Payer: BC Managed Care – PPO | Admitting: Obstetrics and Gynecology

## 2014-07-30 ENCOUNTER — Encounter: Payer: Self-pay | Admitting: Obstetrics and Gynecology

## 2014-07-30 VITALS — BP 135/79 | HR 93 | Ht 66.0 in | Wt 129.0 lb

## 2014-07-30 DIAGNOSIS — Z3041 Encounter for surveillance of contraceptive pills: Secondary | ICD-10-CM

## 2014-07-30 DIAGNOSIS — Z113 Encounter for screening for infections with a predominantly sexual mode of transmission: Secondary | ICD-10-CM

## 2014-07-30 DIAGNOSIS — Z1151 Encounter for screening for human papillomavirus (HPV): Secondary | ICD-10-CM | POA: Diagnosis not present

## 2014-07-30 DIAGNOSIS — Z124 Encounter for screening for malignant neoplasm of cervix: Secondary | ICD-10-CM

## 2014-07-30 DIAGNOSIS — Z01419 Encounter for gynecological examination (general) (routine) without abnormal findings: Secondary | ICD-10-CM

## 2014-07-30 MED ORDER — LEVONORGESTREL-ETHINYL ESTRAD 0.1-20 MG-MCG PO TABS: 1.0000 | ORAL_TABLET | Freq: Every day | ORAL | Status: DC

## 2014-07-30 NOTE — Progress Notes (Signed)
  Subjective:     Kelli Hoffman is a 23 y.o. female G0P0 who is here for a comprehensive physical exam. The patient reports no problems. Patient is sexually active using OCP for contraception.  History   Social History  . Marital Status: Single    Spouse Name: N/A    Number of Children: N/A  . 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 Smoking status: Never Smoker   . Smokeless tobacco: Never Used  . Alcohol Use: No  . Drug Use: No  . Sexual Activity:    Partners: Male    Birth Control/ Protection: Pill   Other Topics Concern  . Not on file   Social History Narrative   Health Maintenance  Topic Date Due  . CHLAMYDIA SCREENING  04/18/2007  . PAP SMEAR  04/17/2010  . TETANUS/TDAP  04/18/2011  . INFLUENZA VACCINE  04/20/2014   Past Medical History  Diagnosis Date  . Anxiety     as a child  . Dysmenorrhea    History reviewed. No pertinent past surgical history. Family History  Problem Relation Age of Onset  . Heart disease Mother   . Heart disease Father   . Cancer Maternal Grandmother     lung, smoker  . Diabetes Other        Review of Systems A comprehensive review of systems was negative.   Objective:      GENERAL: Well-developed, well-nourished female in no acute distress.  HEENT: Normocephalic, atraumatic. Sclerae anicteric.  NECK: Supple. Normal thyroid.  LUNGS: Clear to auscultation bilaterally.  HEART: Regular rate and rhythm. BREASTS: Symmetric in size. No palpable masses or lymphadenopathy, skin changes, or nipple drainage. ABDOMEN: Soft, nontender, nondistended. No organomegaly. PELVIC: Normal external female genitalia. Vagina is pink and rugated.  Normal discharge. Normal appearing cervix. Uterus is normal in size. No adnexal mass or tenderness. EXTREMITIES: No cyanosis, clubbing, or edema, 2+ distal pulses.    Assessment:    Healthy female exam.      Plan:    Pap smear with cultures collected Patient will be contacted with any abnormal results  Patient encouraged to use condoms for STD prevention OCP refill provided according to insurance coverage plan See After Visit Summary for Counseling Recommendations

## 2014-08-05 LAB — CYTOLOGY - PAP

## 2014-08-07 ENCOUNTER — Telehealth: Payer: Self-pay | Admitting: *Deleted

## 2014-08-07 NOTE — Telephone Encounter (Addendum)
I called in Azithromycin 1 gm po x 1.  I have left a message for patient to call office.

## 2014-08-07 NOTE — Telephone Encounter (Signed)
-----   Message from Mora Bellman, MD sent at 08/06/2014  7:46 AM EST ----- Please inform patient of positive chlamydia infection. Please call in Azithromycin 1 gm po x 1. Patient's partner needs to be informed and treated  Thanks  Vickii Chafe

## 2014-08-19 ENCOUNTER — Telehealth: Payer: Self-pay | Admitting: *Deleted

## 2014-08-19 NOTE — Telephone Encounter (Signed)
-----   Message from Mora Bellman, MD sent at 08/06/2014  7:46 AM EST ----- Please inform patient of positive chlamydia infection. Please call in Azithromycin 1 gm po x 1. Patient's partner needs to be informed and treated  Thanks  Vickii Chafe

## 2014-08-19 NOTE — Telephone Encounter (Signed)
Pt called back.  I have tried to contact patient for over a week and patient never returned call.  Pt has picked up prescription and taken the medication.  I advised patient partner needed to be treated and no sex for 2 weeks after treatment.

## 2014-10-28 ENCOUNTER — Telehealth: Payer: Self-pay | Admitting: *Deleted

## 2014-10-28 MED ORDER — NORGESTIMATE-ETH ESTRADIOL 0.25-35 MG-MCG PO TABS: 1.0000 | ORAL_TABLET | Freq: Every day | ORAL | Status: DC

## 2014-10-28 NOTE — Telephone Encounter (Signed)
Patient is having side effects of headache and increased bleeding with her new ocp and she would like to try a different one.  Will call in a different pill and if patient continues to have adverse side effects we will attempt to get a prior authorization for Lo loestrin to be covered.

## 2014-11-26 ENCOUNTER — Encounter: Payer: Self-pay | Admitting: *Deleted

## 2015-07-15 ENCOUNTER — Other Ambulatory Visit: Payer: Self-pay | Admitting: Obstetrics and Gynecology

## 2016-02-14 ENCOUNTER — Other Ambulatory Visit: Payer: Self-pay | Admitting: Obstetrics and Gynecology

## 2016-06-30 ENCOUNTER — Ambulatory Visit (INDEPENDENT_AMBULATORY_CARE_PROVIDER_SITE_OTHER): Payer: Managed Care, Other (non HMO) | Admitting: Family Medicine

## 2016-06-30 ENCOUNTER — Encounter: Payer: Self-pay | Admitting: Family Medicine

## 2016-06-30 VITALS — BP 116/76 | HR 89 | Resp 18 | Ht 66.0 in | Wt 127.0 lb

## 2016-06-30 DIAGNOSIS — Z113 Encounter for screening for infections with a predominantly sexual mode of transmission: Secondary | ICD-10-CM | POA: Diagnosis not present

## 2016-06-30 DIAGNOSIS — Z3041 Encounter for surveillance of contraceptive pills: Secondary | ICD-10-CM | POA: Diagnosis not present

## 2016-06-30 NOTE — Progress Notes (Signed)
   Subjective:    Patient ID: Kelli Hoffman is a 25 y.o. female presenting with Irregular menstrual cycle  on 06/30/2016  HPI: On Lo Loestrin which usually allows her cycle to be regular. Usually has a 5 day cycle. Then bled for 7 days. Has not missed any pills.  Review of Systems  Constitutional: Negative for chills and fever.  Respiratory: Negative for shortness of breath.   Cardiovascular: Negative for chest pain.  Gastrointestinal: Negative for abdominal pain, nausea and vomiting.  Genitourinary: Negative for dysuria.  Skin: Negative for rash.      Objective:    BP 116/76 (BP Location: Left Arm, Patient Position: Sitting, Cuff Size: Small)   Pulse 89   Resp 18   Ht 5\' 6"  (1.676 m)   Wt 127 lb (57.6 kg)   LMP 06/21/2016   BMI 20.50 kg/m  Physical Exam  Constitutional: She is oriented to person, place, and time. She appears well-developed and well-nourished. No distress.  HENT:  Head: Normocephalic and atraumatic.  Eyes: No scleral icterus.  Neck: Neck supple.  Cardiovascular: Normal rate.   Pulmonary/Chest: Effort normal.  Abdominal: Soft.  Neurological: She is alert and oriented to person, place, and time.  Skin: Skin is warm and dry.  Psychiatric: She has a normal mood and affect.        Assessment & Plan:  Screen for STD (sexually transmitted disease) - Does not need pap smear, but does need annual STD screen--performed today. - Plan: RPR, HIV antibody, Urine cytology ancillary only, CANCELED: GC/Chlamydia Probe Amp  Encounter for surveillance of contraceptive pills - since only happened once--will see how next cycle goes--may need a bit more estrogen to stabilize her lining.   Total face-to-face time with patient: 15 minutes. Over 50% of encounter was spent on counseling and coordination of care. Return in about 18 months (around 12/29/2017) for a CPE.  Donnamae Jude 06/30/2016 9:52 AM

## 2016-06-30 NOTE — Progress Notes (Signed)
Pt usually has a 4 day menstrual cycle every month while taking the Lo Loestrin, states her menstrual cycle is becoming more irregular over the past few months.  Had a menstrual cycle from 9/20 to 9/26 and again on 10/2 to 10/9.

## 2016-06-30 NOTE — Patient Instructions (Signed)

## 2016-07-01 LAB — URINE CYTOLOGY ANCILLARY ONLY
Chlamydia: NEGATIVE
Neisseria Gonorrhea: NEGATIVE

## 2016-07-01 LAB — HIV ANTIBODY (ROUTINE TESTING W REFLEX): HIV: NONREACTIVE

## 2016-07-01 LAB — RPR

## 2016-09-27 ENCOUNTER — Other Ambulatory Visit: Payer: Self-pay | Admitting: Obstetrics and Gynecology

## 2017-04-22 ENCOUNTER — Other Ambulatory Visit: Payer: Self-pay | Admitting: Obstetrics and Gynecology

## 2017-05-16 ENCOUNTER — Encounter: Payer: Self-pay | Admitting: Family Medicine

## 2017-05-16 ENCOUNTER — Ambulatory Visit (INDEPENDENT_AMBULATORY_CARE_PROVIDER_SITE_OTHER): Payer: Managed Care, Other (non HMO) | Admitting: Family Medicine

## 2017-05-16 VITALS — BP 130/70 | HR 111 | Ht 65.0 in | Wt 137.0 lb

## 2017-05-16 DIAGNOSIS — N6012 Diffuse cystic mastopathy of left breast: Secondary | ICD-10-CM

## 2017-05-16 NOTE — Patient Instructions (Addendum)
Fibrocystic Breast Changes  Fibrocystic breast changes are changes that can make your breasts swollen or painful. These changes happen when tiny sacs of fluid (cysts) form in the breast. This is a common condition. It does not mean that you have cancer. It usually happens because of hormone changes during a monthly period.  Follow these instructions at home:   Check your breasts after every monthly period. If you do not have monthly periods, check your breasts on the first day of every month. Check for:  ? Soreness.  ? New swelling or puffiness.  ? A change in breast size.  ? A change in a lump that was already there.   Take over-the-counter and prescription medicines only as told by your doctor.   Wear a support or sports bra that fits well. Wear this support especially when you are exercising.   Avoid or have less caffeine, fat, and sugar in what you eat and drink as told by your doctor.  Contact a doctor if:   You have fluid coming from your nipple, especially if the fluid has blood in it.   You have new lumps or bumps in your breast.   Your breast gets puffy, red, and painful.   You have changes in how your breast looks.   Your nipple looks flat or it sinks into your breast.  Get help right away if:   Your breast turns red, and the redness is spreading.  Summary   Fibrocystic breast changes are changes that can make your breasts swollen or painful.   This condition can happen when you have hormone changes during your monthly period.   With this condition, it is important to check your breasts after every monthly period. If you do not have monthly periods, check your breasts on the first day of every month.  This information is not intended to replace advice given to you by your health care provider. Make sure you discuss any questions you have with your health care provider.  Document Released: 08/19/2008 Document Revised: 05/20/2016 Document Reviewed: 05/20/2016  Elsevier Interactive Patient  Education  2017 Elsevier Inc.

## 2017-05-16 NOTE — Progress Notes (Signed)
   Subjective:    Patient ID: Kelli Hoffman is a 26 y.o. female presenting with Breast Problem  on 05/16/2017  HPI: 10 d h/o breast pain and tenderness. Noted it was hard without erythema. That has since resolved. Denies having lump in breast. Pain was with pressure or touch. Now all symptoms have resolved. Denies nipple discharge. Left sided. LMP 9 days ago.  Review of Systems  Constitutional: Negative for chills and fever.  Respiratory: Negative for shortness of breath.   Cardiovascular: Negative for chest pain.  Gastrointestinal: Negative for abdominal pain, nausea and vomiting.  Genitourinary: Negative for dysuria.  Skin: Negative for rash.      Objective:    BP 130/70   Pulse (!) 111   Ht 5\' 5"  (1.651 m)   Wt 137 lb (62.1 kg)   LMP 05/09/2017   BMI 22.80 kg/m  Physical Exam  Constitutional: She is oriented to person, place, and time. She appears well-developed and well-nourished. No distress.  HENT:  Head: Normocephalic and atraumatic.  Eyes: No scleral icterus.  Neck: Neck supple.  Cardiovascular: Normal rate.   Pulmonary/Chest: Effort normal.    Abdominal: Soft.  Neurological: She is alert and oriented to person, place, and time.  Skin: Skin is warm and dry.  Psychiatric: She has a normal mood and affect.        Assessment & Plan:   Problem List Items Addressed This Visit      Unprioritized   Fibrocystic disease of left breast - Primary    Advised of normal course and avoidance of caffeine and return if any worsening and get worked in sooner for symptoms. Consider imaging if returns.         Total face-to-face time with patient: 10 minutes. Over 50% of encounter was spent on counseling and coordination of care. Return if symptoms worsen or fail to improve.  Donnamae Jude 05/16/2017 3:32 PM

## 2017-05-17 DIAGNOSIS — N6012 Diffuse cystic mastopathy of left breast: Secondary | ICD-10-CM | POA: Insufficient documentation

## 2017-05-17 NOTE — Assessment & Plan Note (Signed)
Advised of normal course and avoidance of caffeine and return if any worsening and get worked in sooner for symptoms. Consider imaging if returns.

## 2017-05-30 ENCOUNTER — Encounter: Payer: Self-pay | Admitting: Family Medicine

## 2017-05-30 ENCOUNTER — Ambulatory Visit (INDEPENDENT_AMBULATORY_CARE_PROVIDER_SITE_OTHER): Payer: Managed Care, Other (non HMO) | Admitting: Family Medicine

## 2017-05-30 VITALS — BP 118/77 | HR 95 | Ht 66.0 in | Wt 138.0 lb

## 2017-05-30 DIAGNOSIS — Z3041 Encounter for surveillance of contraceptive pills: Secondary | ICD-10-CM

## 2017-05-30 MED ORDER — NORETHIN ACE-ETH ESTRAD-FE 1-20 MG-MCG(24) PO TABS: 1.0000 | ORAL_TABLET | Freq: Every day | ORAL | 11 refills | Status: DC

## 2017-05-30 NOTE — Progress Notes (Signed)
   Subjective:    Patient ID: Kelli Hoffman is a 26 y.o. female presenting with Menstrual Problem  on 05/30/2017  HPI: On Lo Loestrin. Having cycles that are somewhat irregular. She had one in June and then in August had 2.   Review of Systems  Constitutional: Negative for chills and fever.  Respiratory: Negative for shortness of breath.   Cardiovascular: Negative for chest pain.  Gastrointestinal: Negative for abdominal pain, nausea and vomiting.  Genitourinary: Negative for dysuria.  Skin: Negative for rash.      Objective:    BP 118/77   Pulse 95   Ht 5\' 6"  (1.676 m)   Wt 138 lb (62.6 kg)   LMP 05/18/2017   BMI 22.27 kg/m  Physical Exam  Constitutional: She is oriented to person, place, and time. She appears well-developed and well-nourished. No distress.  HENT:  Head: Normocephalic and atraumatic.  Eyes: No scleral icterus.  Neck: Neck supple.  Cardiovascular: Normal rate.   Pulmonary/Chest: Effort normal.  Abdominal: Soft.  Neurological: She is alert and oriented to person, place, and time.  Skin: Skin is warm and dry.  Psychiatric: She has a normal mood and affect.        Assessment & Plan:  Encounter for surveillance of contraceptive pills - will change to Loestrin and if not effective consider change to Sprintec and 35 mcg pill. Uninterested in IUD or Ring or other method. - Plan: Norethindrone Acetate-Ethinyl Estrad-FE (LOESTRIN 24 FE) 1-20 MG-MCG(24) tablet   Total face-to-face time with patient: 15 minutes. Over 50% of encounter was spent on counseling and coordination of care. Return in about 2 months (around 07/30/2017) for a CPE.  Donnamae Jude 05/30/2017 2:08 PM

## 2017-07-25 ENCOUNTER — Ambulatory Visit (INDEPENDENT_AMBULATORY_CARE_PROVIDER_SITE_OTHER): Payer: Managed Care, Other (non HMO)

## 2017-07-25 ENCOUNTER — Encounter: Payer: Self-pay | Admitting: Podiatry

## 2017-07-25 ENCOUNTER — Ambulatory Visit: Payer: Managed Care, Other (non HMO) | Admitting: Podiatry

## 2017-07-25 VITALS — BP 111/84 | HR 82 | Resp 16

## 2017-07-25 DIAGNOSIS — M722 Plantar fascial fibromatosis: Secondary | ICD-10-CM | POA: Diagnosis not present

## 2017-07-25 MED ORDER — METHYLPREDNISOLONE 4 MG PO TBPK: ORAL_TABLET | ORAL | 0 refills | Status: DC

## 2017-07-25 MED ORDER — MELOXICAM 15 MG PO TABS: 15.0000 mg | ORAL_TABLET | Freq: Every day | ORAL | 3 refills | Status: DC

## 2017-07-25 NOTE — Patient Instructions (Signed)

## 2017-07-25 NOTE — Progress Notes (Signed)
  Subjective:  Patient ID: Kelli Hoffman, female    DOB: [birth year],  MRN: [id number] HPI Chief Complaint  Patient presents with  . Foot Pain    Plantar heel right - aching x few months, AM pain, no treatment, exercises a lot and is interferring    26 y.o. female presents with the above complaint.     Past Medical History:  Diagnosis Date  . Anxiety    as a child  . Dysmenorrhea    No past surgical history on file.  Current Outpatient Medications:  .  Norethindrone Acetate-Ethinyl Estrad-FE (LOESTRIN 24 FE) 1-20 MG-MCG(24) tablet, Take 1 tablet by mouth daily., Disp: 1 Package, Rfl: 11  Allergies  Allergen Reactions  . Penicillins     REACTION: Family allergy to Penicillin.  Patient has never had a problem but tries to avoid it.   Review of Systems  All other systems reviewed and are negative.  Objective:   Vitals:   07/25/17 1027  BP: 111/84  Pulse: 82  Resp: 16    General: Well developed, nourished, in no acute distress, alert and oriented x3   Dermatological: Skin is warm, dry and supple bilateral. Nails x 10 are well maintained; remaining integument appears unremarkable at this time. There are no open sores, no preulcerative lesions, no rash or signs of infection present.  Vascular: Dorsalis Pedis artery and Posterior Tibial artery pedal pulses are 2/4 bilateral with immedate capillary fill time. Pedal hair growth present. No varicosities and no lower extremity edema present bilateral.   Neruologic: Grossly intact via light touch bilateral. Vibratory intact via tuning fork bilateral. Protective threshold with Semmes Wienstein monofilament intact to all pedal sites bilateral. Patellar and Achilles deep tendon reflexes 2+ bilateral. No Babinski or clonus noted bilateral.   Musculoskeletal: No gross boney pedal deformities bilateral. No pain, crepitus, or limitation noted with foot and ankle range of motion bilateral. Muscular strength 5/5 in all groups tested  bilateral. She has pain on direct palpation medial continue tubercle of the right heel only.  Gait: Unassisted, Nonantalgic.    Radiographs:  3 views of the right foot demonstrates soft tissue increase in density at the plantar fascial continue insertion site of the right heel.  Assessment & Plan:   Assessment: Plantar fasciitis right heel.  Plan: Discussed etiology pathology conservative versus surgical therapies. We discussed appropriate shoe gear stretching exercises ice therapy and shoe modifications. Injected her right heel today with Kenalog and local anesthetic placed her into a plantar fascia brace to be followed by a night splint. Start her on a Medrol Dosepak to be followed by meloxicam. Follow up with her in 1 month.     Crystalle Popwell T. [place], [place]

## 2017-07-25 NOTE — Progress Notes (Signed)
  Subjective:  Patient ID: Fara Olden, female    DOB: Aug 21, 1991,  MRN: [id number] HPI Chief Complaint  Patient presents with  . Foot Pain    Plantar heel right - aching x few months, AM pain, no treatment, exercises a lot and is interferring    26 y.o. female presents with the above complaint.    Past Medical History:  Diagnosis Date  . Anxiety    as a child  . Dysmenorrhea    No past surgical history on file.  Current Outpatient Medications:  .  Norethindrone Acetate-Ethinyl Estrad-FE (LOESTRIN 24 FE) 1-20 MG-MCG(24) tablet, Take 1 tablet by mouth daily., Disp: 1 Package, Rfl: 11  Allergies  Allergen Reactions  . Penicillins     REACTION: Family allergy to Penicillin.  Patient has never had a problem but tries to avoid it.   Review of Systems  All other systems reviewed and are negative.  Objective:   Vitals:   07/25/17 1027  BP: 111/84  Pulse: 82  Resp: 16    General: Well developed, nourished, in no acute distress, alert and oriented x3   Dermatological: Skin is warm, dry and supple bilateral. Nails x 10 are well maintained; remaining integument appears unremarkable at this time. There are no open sores, no preulcerative lesions, no rash or signs of infection present.  Vascular: Dorsalis Pedis artery and Posterior Tibial artery pedal pulses are 2/4 bilateral with immedate capillary fill time. Pedal hair growth present. No varicosities and no lower extremity edema present bilateral.   Neruologic: Grossly intact via light touch bilateral. Vibratory intact via tuning fork bilateral. Protective threshold with Semmes Wienstein monofilament intact to all pedal sites bilateral. Patellar and Achilles deep tendon reflexes 2+ bilateral. No Babinski or clonus noted bilateral.   Musculoskeletal: No gross boney pedal deformities bilateral. No pain, crepitus, or limitation noted with foot and ankle range of motion bilateral. Muscular strength 5/5 in all groups tested  bilateral.  Gait: Unassisted, Nonantalgic.    Radiographs:    Assessment & Plan:   Assessment:   Plan:     Max T. [place], [place]

## 2017-08-22 ENCOUNTER — Ambulatory Visit: Payer: Managed Care, Other (non HMO) | Admitting: Podiatry

## 2018-04-11 ENCOUNTER — Telehealth: Payer: Self-pay | Admitting: Family Medicine

## 2018-04-11 NOTE — Telephone Encounter (Signed)
Pt mother Mardene Celeste called to request Bl[place]ovi 24 FE samples if available for two months supply. Mother states pt turns 27 on 03/21/18 and will no longer be elig to stay on her ins. They are shopping around to find ins for pt. Call mother to let her know if we are able to provide samples for two months since it is $80 self pay without ins.

## 2018-04-12 NOTE — Telephone Encounter (Signed)
Mother Mardene Celeste returned call from yesterday. Checking status of request for samples bc for pt.

## 2018-04-12 NOTE — Telephone Encounter (Signed)
Called patient to inform her we can give her samples until she can get her new insurance.

## 2018-10-25 ENCOUNTER — Ambulatory Visit (INDEPENDENT_AMBULATORY_CARE_PROVIDER_SITE_OTHER): Payer: 59 | Admitting: Advanced Practice Midwife

## 2018-10-25 ENCOUNTER — Encounter: Payer: Self-pay | Admitting: Advanced Practice Midwife

## 2018-10-25 VITALS — BP 101/72 | HR 72 | Wt 131.2 lb

## 2018-10-25 DIAGNOSIS — A749 Chlamydial infection, unspecified: Secondary | ICD-10-CM | POA: Insufficient documentation

## 2018-10-25 DIAGNOSIS — N6012 Diffuse cystic mastopathy of left breast: Secondary | ICD-10-CM

## 2018-10-25 DIAGNOSIS — Z3041 Encounter for surveillance of contraceptive pills: Secondary | ICD-10-CM | POA: Diagnosis not present

## 2018-10-25 NOTE — Patient Instructions (Signed)
Fibrocystic Breast Changes    Fibrocystic breast changes are changes that can make your breasts swollen or painful. These changes happen when tiny sacs of fluid (cysts) form in the breast. This is a common condition. It does not mean that you have cancer. It usually happens because of hormone changes during a monthly period.  Follow these instructions at home:   Check your breasts after every monthly period. If you do not have monthly periods, check your breasts on the first day of every month. Check for:  ? Soreness.  ? New swelling or puffiness.  ? A change in breast size.  ? A change in a lump that was already there.   Take over-the-counter and prescription medicines only as told by your doctor.   Wear a support or sports bra that fits well. Wear this support especially when you are exercising.   Avoid or have less caffeine, fat, and sugar in what you eat and drink as told by your doctor.  Contact a doctor if:   You have fluid coming from your nipple, especially if the fluid has blood in it.   You have new lumps or bumps in your breast.   Your breast gets puffy, red, and painful.   You have changes in how your breast looks.   Your nipple looks flat or it sinks into your breast.  Get help right away if:   Your breast turns red, and the redness is spreading.  Summary   Fibrocystic breast changes are changes that can make your breasts swollen or painful.   This condition can happen when you have hormone changes during your monthly period.   With this condition, it is important to check your breasts after every monthly period. If you do not have monthly periods, check your breasts on the first day of every month.  This information is not intended to replace advice given to you by your health care provider. Make sure you discuss any questions you have with your health care provider.  Document Released: 08/19/2008 Document Revised: 05/20/2016 Document Reviewed: 05/20/2016  Elsevier Interactive Patient  Education  2019 Elsevier Inc.

## 2018-10-25 NOTE — Progress Notes (Signed)
GYNECOLOGY ANNUAL PREVENTATIVE CARE ENCOUNTER NOTE  Subjective:   Kelli Hoffman is a 28 y.o. G0P0000 female here for contraception counseling.  Current complaints: Patient is an Secondary school teacher and feels that her Loestrin is causing her to have bloated legs. She has talked to her personal trainer and changing her contraception to eliminate lower body bloating.  She also  Denies abnormal vaginal bleeding, discharge, pelvic pain, problems with intercourse or other gynecologic concerns.    Gynecologic History Patient's last menstrual period was 10/02/2018. Contraception: OCP (estrogen/progesterone) Last Pap: 2015. Results were: normal with negative HPV, positive Chlamydia   Obstetric History OB History  Gravida Para Term Preterm AB Living  0 0 0 0 0 0  SAB TAB Ectopic Multiple Live Births  0 0 0 0 0    Past Medical History:  Diagnosis Date  . Anxiety    as a child  . Dysmenorrhea     No past surgical history on file.  Current Outpatient Medications on File Prior to Visit  Medication Sig Dispense Refill  . meloxicam (MOBIC) 15 MG tablet Take 1 tablet (15 mg total) daily by mouth. 30 tablet 3  . Norethindrone Acetate-Ethinyl Estrad-FE (LOESTRIN 24 FE) 1-20 MG-MCG(24) tablet Take 1 tablet by mouth daily. 1 Package 11  . methylPREDNISolone (MEDROL DOSEPAK) 4 MG TBPK tablet 6 day dose pack - take as directed (Patient not taking: Reported on 10/25/2018) 21 tablet 0   No current facility-administered medications on file prior to visit.     Allergies  Allergen Reactions  . Penicillins     REACTION: Family allergy to Penicillin.  Patient has never had a problem but tries to avoid it.    Social History:  reports that she has never smoked. She has never used smokeless tobacco. She reports that she does not drink alcohol or use drugs.  Family History  Problem Relation Age of Onset  . Diabetes Other   . Heart disease Mother   . Heart disease Father   . Cancer Maternal  Grandmother        lung, smoker    The following portions of the patient's history were reviewed and updated as appropriate: allergies, current medications, past family history, past medical history, past social history, past surgical history and problem list.  Review of Systems Pertinent items noted in HPI and remainder of comprehensive ROS otherwise negative.   Objective:  BP 101/72   Pulse 72   Wt 131 lb 3.2 oz (59.5 kg)   LMP 10/02/2018   BMI 21.18 kg/m  CONSTITUTIONAL: Well-developed, well-nourished female in no acute distress.  HENT:  Normocephalic, atraumatic, External right and left ear normal. Oropharynx is clear and moist EYES: Conjunctivae and EOM are normal. Pupils are equal, round, and reactive to light. No scleral icterus.  NECK: Normal range of motion, supple, no masses.  Normal thyroid.  SKIN: Skin is warm and dry. No rash noted. Not diaphoretic. No erythema. No pallor. MUSCULOSKELETAL: Normal range of motion. No tenderness.  No cyanosis, clubbing, or edema.  2+ distal pulses. NEUROLOGIC: Alert and oriented to person, place, and time. Normal reflexes, muscle tone coordination. No cranial nerve deficit noted. PSYCHIATRIC: Normal mood and affect. Normal behavior. Normal judgment and thought content. CARDIOVASCULAR: Normal heart rate noted, regular rhythm RESPIRATORY: Clear to auscultation bilaterally. Effort normal, no problems with respiration noted. BREASTS: Symmetric in size. No masses, skin changes, nipple drainage, or lymphadenopathy. ABDOMEN: Soft, no distention noted.  No tenderness, rebound or guarding.  PELVIC:  Not evaluated   Assessment and Plan:  1. Encounter for surveillance of contraceptive pills --Patient desires relief from spotting and cramping offered by OCPs. Discussed that small amount of bloating is the result of same hormonal influence which helps with the spotting and cramping --Discussed trade off of positive and negative symptoms   2.  Fibrocystic disease of left breast --Small palpable cyst at 4 o'clock with glandular tissue palpated one exam --Offered imaging, patient to consider and call clinic --Patient to keep journal of breast pain, palpable cyst and changes associated with her birth control regimen, weight loss in training, and menstrual cycle  Routine preventative health maintenance measures emphasized. Please refer to After Visit Summary for other counseling recommendations.   Total visit time 20 minutes. Greater than 50% spent in counseling and coordination of care.  Mallie Snooks, [place] for [employer], [place]

## 2018-10-25 NOTE — Progress Notes (Signed)
Spotting x 3 weeks since starting and stopping birth control.

## 2018-12-06 ENCOUNTER — Other Ambulatory Visit: Payer: Self-pay

## 2018-12-06 ENCOUNTER — Ambulatory Visit (INDEPENDENT_AMBULATORY_CARE_PROVIDER_SITE_OTHER): Payer: 59 | Admitting: Advanced Practice Midwife

## 2018-12-06 ENCOUNTER — Encounter: Payer: Self-pay | Admitting: Advanced Practice Midwife

## 2018-12-06 VITALS — BP 117/79 | HR 72 | Wt 129.8 lb

## 2018-12-06 DIAGNOSIS — Z124 Encounter for screening for malignant neoplasm of cervix: Secondary | ICD-10-CM

## 2018-12-06 DIAGNOSIS — Z01419 Encounter for gynecological examination (general) (routine) without abnormal findings: Secondary | ICD-10-CM | POA: Diagnosis not present

## 2018-12-06 DIAGNOSIS — Z1151 Encounter for screening for human papillomavirus (HPV): Secondary | ICD-10-CM | POA: Diagnosis not present

## 2018-12-06 NOTE — Progress Notes (Signed)
GYNECOLOGY ANNUAL PREVENTATIVE CARE ENCOUNTER NOTE  History:     Kelli Hoffman is a 28 y.o. G0P0000 female here for a routine annual gynecologic exam.  Current complaints: Ongoing spotting with OCPs.   Denies abnormal vaginal bleeding, discharge, pelvic pain, problems with intercourse or other gynecologic concerns. Patient is frustrated that her workouts are being compromised by gym closures and her April competition was recently canceled.   Gynecologic History No LMP recorded. Contraception: OCP (estrogen/progesterone), abstinent for the past year Last Pap: 07/2014. Results were: normal with negative HPV   Obstetric History OB History  Gravida Para Term Preterm AB Living  0 0 0 0 0 0  SAB TAB Ectopic Multiple Live Births  0 0 0 0 0    Past Medical History:  Diagnosis Date  . Anxiety    as a child  . Dysmenorrhea     No past surgical history on file.  Current Outpatient Medications on File Prior to Visit  Medication Sig Dispense Refill  . meloxicam (MOBIC) 15 MG tablet Take 1 tablet (15 mg total) daily by mouth. 30 tablet 3  . Norethindrone Acetate-Ethinyl Estrad-FE (LOESTRIN 24 FE) 1-20 MG-MCG(24) tablet Take 1 tablet by mouth daily. 1 Package 11  . methylPREDNISolone (MEDROL DOSEPAK) 4 MG TBPK tablet 6 day dose pack - take as directed (Patient not taking: Reported on 10/25/2018) 21 tablet 0   No current facility-administered medications on file prior to visit.     Allergies  Allergen Reactions  . Penicillins     REACTION: Family allergy to Penicillin.  Patient has never had a problem but tries to avoid it.    Social History:  reports that she has never smoked. She has never used smokeless tobacco. She reports that she does not drink alcohol or use drugs.  Family History  Problem Relation Age of Onset  . Diabetes Other   . Heart disease Mother   . Heart disease Father   . Cancer Maternal Grandmother        lung, smoker    The following portions of the  patient's history were reviewed and updated as appropriate: allergies, current medications, past family history, past medical history, past social history, past surgical history and problem list.  Review of Systems Pertinent items noted in HPI and remainder of comprehensive ROS otherwise negative.  Physical Exam:  BP 117/79   Pulse 72   Wt 129 lb 12.8 oz (58.9 kg)   BMI 20.95 kg/m  CONSTITUTIONAL: Well-developed, well-nourished female in no acute distress.  HENT:  Normocephalic, atraumatic, External right and left ear normal. Oropharynx is clear and moist EYES: Conjunctivae and EOM are normal. Pupils are equal, round, and reactive to light. No scleral icterus.  NECK: Normal range of motion, supple, no masses.  Normal thyroid.  SKIN: Skin is warm and dry. No rash noted. Not diaphoretic. No erythema. No pallor. MUSCULOSKELETAL: Normal range of motion. No tenderness.  No cyanosis, clubbing, or edema.  2+ distal pulses. NEUROLOGIC: Alert and oriented to person, place, and time. Normal reflexes, muscle tone coordination. No cranial nerve deficit noted. PSYCHIATRIC: Normal mood and affect. Normal behavior. Normal judgment and thought content. CARDIOVASCULAR: Normal heart rate noted, regular rhythm RESPIRATORY: Clear to auscultation bilaterally. Effort and breath sounds normal, no problems with respiration noted. BREASTS: Symmetric in size. No masses, skin changes, nipple drainage, or lymphadenopathy. ABDOMEN: Soft, normal bowel sounds, no distention noted.  No tenderness, rebound or guarding.  PELVIC: Normal appearing external genitalia; normal appearing vaginal  mucosa and cervix.  No abnormal discharge noted.  Pap smear obtained.  Normal uterine size, no other palpable masses, no uterine or adnexal tenderness. No bleeding observed    Assessment and Plan:    1. Well woman exam with routine gynecological exam - Offered to move to [facility] with different hormone level to stabilize spotting -  Fibrocystic breast changes unchanged from previous visit - Cytology - PAP( [facility]) - Advised patient to schedule follow-up six months to assess impact of lifestyle changes on menstrual cycle and spotting - Patient to call clinic if she would like to switch to different OCP e.g. Sprintec   Will follow up results of pap smear and manage accordingly. Routine preventative health maintenance measures emphasized. Please refer to After Visit Summary for other counseling recommendations.   Total visit time 30 minutes. Greater than 50% spent in counseling and coordination of care    Mallie Snooks, [facility] for [place]

## 2018-12-06 NOTE — Progress Notes (Signed)
Last pap 07/30/2014-normal pap

## 2018-12-07 LAB — CYTOLOGY - PAP
Diagnosis: NEGATIVE
HPV (WINDOPATH): NOT DETECTED

## 2019-03-05 ENCOUNTER — Ambulatory Visit (INDEPENDENT_AMBULATORY_CARE_PROVIDER_SITE_OTHER): Payer: 59 | Admitting: Family Medicine

## 2019-03-05 ENCOUNTER — Encounter: Payer: Self-pay | Admitting: Family Medicine

## 2019-03-05 ENCOUNTER — Other Ambulatory Visit: Payer: Self-pay

## 2019-03-05 VITALS — BP 110/73 | HR 96 | Wt 143.0 lb

## 2019-03-05 DIAGNOSIS — N939 Abnormal uterine and vaginal bleeding, unspecified: Secondary | ICD-10-CM | POA: Diagnosis not present

## 2019-03-05 NOTE — Patient Instructions (Signed)
Abnormal Uterine Bleeding  Abnormal uterine bleeding means bleeding more than usual from your uterus. It can include:   Bleeding between periods.   Bleeding after sex.   Bleeding that is heavier than normal.   Periods that last longer than usual.   Bleeding after you have stopped having your period (menopause).  There are many problems that may cause this. You should see a doctor for any kind of bleeding that is not normal. Treatment depends on the cause of the bleeding.  Follow these instructions at home:   Watch your condition for any changes.   Do not use tampons, douche, or have sex, if your doctor tells you not to.   Change your pads often.   Get regular well-woman exams. Make sure they include a pelvic exam and cervical cancer screening.   Keep all follow-up visits as told by your doctor. This is important.  Contact a doctor if:   The bleeding lasts more than one week.   You feel dizzy at times.   You feel like you are going to throw up (nauseous).   You throw up.  Get help right away if:   You pass out.   You have to change pads every hour.   You have belly (abdominal) pain.   You have a fever.   You get sweaty.   You get weak.   You passing large blood clots from your vagina.  Summary   Abnormal uterine bleeding means bleeding more than usual from your uterus.   There are many problems that may cause this. You should see a doctor for any kind of bleeding that is not normal.   Treatment depends on the cause of the bleeding.  This information is not intended to replace advice given to you by your health care provider. Make sure you discuss any questions you have with your health care provider.  Document Released: 07/04/2009 Document Revised: 08/31/2016 Document Reviewed: 08/31/2016  Elsevier Interactive Patient Education  2019 Elsevier Inc.

## 2019-03-05 NOTE — Progress Notes (Signed)
Patient is still having spotting between periods. Has stopped the [facility]

## 2019-03-05 NOTE — Progress Notes (Signed)
   Subjective:    Patient ID: Kelli Hoffman is a 28 y.o. female presenting with No chief complaint on file.  on 03/05/2019  HPI: On Blysovi birth control. Re-Started in January 2020 and noted that she is having spotting almost daily. Came off due to water retention and competing. So she stopped it for one month and is now still spotting. No pain. Has had this issue before, better with Lo-estrin, and her insurance changed and wouldn't cover this.  Review of Systems  Constitutional: Negative for chills and fever.  Respiratory: Negative for shortness of breath.   Cardiovascular: Negative for chest pain.  Gastrointestinal: Negative for abdominal pain, nausea and vomiting.  Genitourinary: Negative for dysuria.  Skin: Negative for rash.      Objective:    BP 110/73   Pulse 96   Wt 143 lb (64.9 kg)   LMP 02/02/2019 (Within Weeks)   BMI 23.08 kg/m  Physical Exam Constitutional:      General: She is not in acute distress.    Appearance: She is well-developed.  HENT:     Head: Normocephalic and atraumatic.  Eyes:     General: No scleral icterus. Neck:     Musculoskeletal: Neck supple.  Cardiovascular:     Rate and Rhythm: Normal rate.  Pulmonary:     Effort: Pulmonary effort is normal.  Abdominal:     Palpations: Abdomen is soft.  Skin:    General: Skin is warm and dry.  Neurological:     Mental Status: She is alert and oriented to person, place, and time.         Assessment & Plan:   Problem List Items Addressed This Visit    None    Visit Diagnoses    Abnormal uterine bleeding    -  Primary   Unclear etioloy---check labs, u/s.   Relevant Orders   US PELVIC COMPLETE WITH TRANSVAGINAL   CBC   TSH        Total face-to-face time with patient: 15 minutes. Over 50% of encounter was spent on counseling and coordination of care. Return in about 4 weeks (around 04/02/2019).  Donnamae Jude 03/05/2019 2:55 PM

## 2019-03-06 ENCOUNTER — Telehealth: Payer: Self-pay | Admitting: *Deleted

## 2019-03-06 LAB — CBC
Hematocrit: 45.1 % (ref 34.0–46.6)
Hemoglobin: 15 g/dL (ref 11.1–15.9)
MCH: 28.4 pg (ref 26.6–33.0)
MCHC: 33.3 g/dL (ref 31.5–35.7)
MCV: 85 fL (ref 79–97)
Platelets: 201 10*3/uL (ref 150–450)
RBC: 5.29 x10E6/uL — ABNORMAL HIGH (ref 3.77–5.28)
RDW: 12.1 % (ref 11.7–15.4)
WBC: 9.5 10*3/uL (ref 3.4–10.8)

## 2019-03-06 LAB — TSH: TSH: 1.95 u[IU]/mL (ref 0.450–4.500)

## 2019-03-06 NOTE — Telephone Encounter (Signed)
Informed of lab results and reminded to sign up for MyChart.

## 2019-03-09 ENCOUNTER — Ambulatory Visit: Payer: 59

## 2019-04-16 ENCOUNTER — Ambulatory Visit: Payer: 59 | Admitting: Family Medicine

## 2019-04-19 ENCOUNTER — Encounter: Payer: Self-pay | Admitting: Radiology

## 2019-04-23 ENCOUNTER — Encounter: Payer: Self-pay | Admitting: Family Medicine

## 2019-05-02 ENCOUNTER — Ambulatory Visit (INDEPENDENT_AMBULATORY_CARE_PROVIDER_SITE_OTHER): Payer: 59 | Admitting: Family Medicine

## 2019-05-02 ENCOUNTER — Encounter: Payer: Self-pay | Admitting: Family Medicine

## 2019-05-02 ENCOUNTER — Other Ambulatory Visit: Payer: Self-pay

## 2019-05-02 VITALS — BP 121/79 | HR 72 | Wt 141.8 lb

## 2019-05-02 DIAGNOSIS — N939 Abnormal uterine and vaginal bleeding, unspecified: Secondary | ICD-10-CM | POA: Diagnosis not present

## 2019-05-02 MED ORDER — DOXYCYCLINE HYCLATE 100 MG PO CAPS: 100.0000 mg | ORAL_CAPSULE | Freq: Two times a day (BID) | ORAL | 0 refills | Status: DC

## 2019-05-02 NOTE — Progress Notes (Signed)
   Subjective:    Patient ID: Kelli Hoffman is a 28 y.o. female presenting with Discuss u/s  on 05/02/2019  HPI: Still with spotting irregularly. She has been off her OC's for some time. She has a normal TSH, CBC and pelvic u/s. She is working out for competition, and does lose her cycle at that time. She had an endometrial stripe of 8mm. Loestrin works best but her insurance stopped covering it.  Review of Systems  Constitutional: Negative for chills and fever.  Respiratory: Negative for shortness of breath.   Cardiovascular: Negative for chest pain.  Gastrointestinal: Negative for abdominal pain, nausea and vomiting.  Genitourinary: Positive for vaginal bleeding. Negative for dysuria.  Skin: Negative for rash.      Objective:    BP 121/79   Pulse 72   Wt 141 lb 12.8 oz (64.3 kg)   LMP 04/06/2019   BMI 22.89 kg/m  Physical Exam Constitutional:      General: She is not in acute distress.    Appearance: She is well-developed.  HENT:     Head: Normocephalic and atraumatic.  Eyes:     General: No scleral icterus. Neck:     Musculoskeletal: Neck supple.  Cardiovascular:     Rate and Rhythm: Normal rate.  Pulmonary:     Effort: Pulmonary effort is normal.  Abdominal:     Palpations: Abdomen is soft.  Skin:    General: Skin is warm and dry.  Neurological:     Mental Status: She is alert and oriented to person, place, and time.         Assessment & Plan:   Problem List Items Addressed This Visit      Unprioritized   Abnormal uterine bleeding - Primary    Will treat presumptively for endometritis--if no better, consider resumption of Loestrin--she wants to wait until competition is done, will f/u after that in November.      Relevant Medications   doxycycline (VIBRAMYCIN) 100 MG capsule      Total face-to-face time with patient: 15 minutes. Over 50% of encounter was spent on counseling and coordination of care. Return in about 3 months (around 08/02/2019) for  a follow-up.  Donnamae Jude 05/02/2019 11:05 AM

## 2019-05-02 NOTE — Assessment & Plan Note (Signed)
Will treat presumptively for endometritis--if no better, consider resumption of Loestrin--she wants to wait until competition is done, will f/u after that in November.

## 2019-05-17 ENCOUNTER — Telehealth: Payer: Self-pay | Admitting: Radiology

## 2019-05-17 NOTE — Telephone Encounter (Signed)
Left message for patient to call to schedule appointment with Dr Kennon Rounds to reevaluate.

## 2019-05-29 MED ORDER — NORETHIN-ETH ESTRAD-FE BIPHAS 1 MG-10 MCG / 10 MCG PO TABS: 1.0000 | ORAL_TABLET | Freq: Every day | ORAL | 11 refills | Status: DC

## 2019-06-11 ENCOUNTER — Encounter: Payer: Self-pay | Admitting: Family Medicine

## 2019-06-11 ENCOUNTER — Ambulatory Visit (INDEPENDENT_AMBULATORY_CARE_PROVIDER_SITE_OTHER): Payer: 59 | Admitting: Family Medicine

## 2019-06-11 ENCOUNTER — Other Ambulatory Visit: Payer: Self-pay

## 2019-06-11 DIAGNOSIS — N939 Abnormal uterine and vaginal bleeding, unspecified: Secondary | ICD-10-CM | POA: Diagnosis not present

## 2019-06-11 NOTE — Assessment & Plan Note (Signed)
Suspect this is related to low estrogen. She will give herself about 3 months to get back used to this dosage prior to switching again.

## 2019-06-11 NOTE — Progress Notes (Signed)
   Subjective:    Patient ID: Kelli Hoffman is a 28 y.o. female presenting with Follow-up (AUB)  on 06/11/2019  HPI: Here to f/u. We have been working on changing her OC's to stop her breakthrough bleeding. She initially stopped her OC's on her own, due to bloating, but then had spotting so resumed her Lo-estrin. We have most recently switched her back to Lo-Loestrin and her spotting has lightened but continued.  Review of Systems  Constitutional: Negative for chills and fever.  Respiratory: Negative for shortness of breath.   Cardiovascular: Negative for chest pain.  Gastrointestinal: Negative for abdominal pain, nausea and vomiting.  Genitourinary: Negative for dysuria.  Skin: Negative for rash.      Objective:    BP 117/66   Pulse 72   Wt 135 lb 9.6 oz (61.5 kg)   LMP 05/31/2019   BMI 21.89 kg/m  Physical Exam Constitutional:      General: She is not in acute distress.    Appearance: She is well-developed.  HENT:     Head: Normocephalic and atraumatic.  Eyes:     General: No scleral icterus. Neck:     Musculoskeletal: Neck supple.  Cardiovascular:     Rate and Rhythm: Normal rate.  Pulmonary:     Effort: Pulmonary effort is normal.  Abdominal:     Palpations: Abdomen is soft.  Skin:    General: Skin is warm and dry.  Neurological:     Mental Status: She is alert and oriented to person, place, and time.         Assessment & Plan:   Problem List Items Addressed This Visit      Unprioritized   Abnormal uterine bleeding    Suspect this is related to low estrogen. She will give herself about 3 months to get back used to this dosage prior to switching again.         Total face-to-face time with patient: 10 minutes. Over 50% of encounter was spent on counseling and coordination of care. Return in about 3 months (around 09/10/2019).  Kelli Hoffman 06/11/2019 4:19 PM

## 2019-10-03 ENCOUNTER — Other Ambulatory Visit: Payer: 59

## 2019-10-03 ENCOUNTER — Other Ambulatory Visit: Payer: Self-pay

## 2019-10-03 DIAGNOSIS — E038 Other specified hypothyroidism: Secondary | ICD-10-CM

## 2019-10-03 NOTE — Progress Notes (Signed)
Pt's coach is requesting her hormones to be checked before competition. Okay per Dr Ernestina Patches to order lab work.

## 2019-10-06 LAB — TSH: TSH: 1.53 u[IU]/mL (ref 0.450–4.500)

## 2019-10-06 LAB — T4, FREE: Free T4: 1.39 ng/dL (ref 0.82–1.77)

## 2019-10-06 LAB — PROGESTERONE: Progesterone: 0.2 ng/mL

## 2019-10-06 LAB — T3, FREE: T3, Free: 3.3 pg/mL (ref 2.0–4.4)

## 2019-10-06 LAB — ESTROGENS, TOTAL: Estrogen: 139 pg/mL

## 2019-10-06 LAB — TESTOSTERONE,FREE AND TOTAL
Testosterone, Free: 4.1 pg/mL (ref 0.0–4.2)
Testosterone: 44 ng/dL (ref 8–48)

## 2020-04-15 ENCOUNTER — Other Ambulatory Visit: Payer: Self-pay | Admitting: Family Medicine

## 2020-06-02 ENCOUNTER — Encounter: Payer: Self-pay | Admitting: Radiology

## 2020-07-17 ENCOUNTER — Ambulatory Visit (INDEPENDENT_AMBULATORY_CARE_PROVIDER_SITE_OTHER): Payer: 59 | Admitting: Family Medicine

## 2020-07-17 ENCOUNTER — Encounter: Payer: Self-pay | Admitting: Family Medicine

## 2020-07-17 ENCOUNTER — Other Ambulatory Visit: Payer: Self-pay

## 2020-07-17 DIAGNOSIS — N939 Abnormal uterine and vaginal bleeding, unspecified: Secondary | ICD-10-CM

## 2020-07-17 MED ORDER — NORETHIN ACE-ETH ESTRAD-FE 1-20 MG-MCG(24) PO TABS: 1.0000 | ORAL_TABLET | Freq: Every day | ORAL | 11 refills | Status: AC

## 2020-07-17 NOTE — Progress Notes (Signed)
Stopped OCP'c 2 months ago due to spotting and still is having spotting issues, started a new supplement which has helped the spotting some

## 2020-07-17 NOTE — Assessment & Plan Note (Signed)
Ongoing likely related to so much exercise. Will change to Loestrin and see how she does.

## 2020-07-17 NOTE — Progress Notes (Addendum)
    Subjective:    Patient ID: Kelli Hoffman is a 29 y.o. female presenting with Menstrual Problem  on 07/17/2020  HPI: Having some spotting between cycles. Has long h/o similar and we have last seen her in 05/2019. Was on Lo Loestrin and having some spotting. Stopped this 2 months ago. Was spotting. Taking a supplement with Folate, chromium, myo-inositol, L-glutathione, Na alphalipoic acid, diindolymethane  Review of Systems  Constitutional: Negative for chills and fever.  Respiratory: Negative for shortness of breath.   Cardiovascular: Negative for chest pain.  Gastrointestinal: Negative for abdominal pain, nausea and vomiting.  Genitourinary: Negative for dysuria.  Skin: Negative for rash.      Objective:    BP 121/76   Pulse 97   Wt 144 lb (65.3 kg)   LMP 07/03/2020 (Exact Date)   BMI 23.24 kg/m  Physical Exam Constitutional:      General: She is not in acute distress.    Appearance: She is well-developed.  HENT:     Head: Normocephalic and atraumatic.  Eyes:     General: No scleral icterus. Cardiovascular:     Rate and Rhythm: Normal rate.  Pulmonary:     Effort: Pulmonary effort is normal.  Abdominal:     Palpations: Abdomen is soft.  Musculoskeletal:     Cervical back: Neck supple.  Skin:    General: Skin is warm and dry.  Neurological:     Mental Status: She is alert and oriented to person, place, and time.         Assessment & Plan:   Problem List Items Addressed This Visit      Unprioritized   Abnormal uterine bleeding    Ongoing likely related to so much exercise. Will change to Loestrin and see how she does.      Relevant Medications   Norethindrone Acetate-Ethinyl Estrad-FE (LOESTRIN 24 FE) 1-20 MG-MCG(24) tablet      Total time in review of prior notes, pathology, labs, history taking, review with patient, exam, note writing, discussion of options, plan for next steps, alternatives and risks of treatment: 20  minutes.  Return in about 3  months (around 10/17/2020).  Donnamae Jude 07/17/2020 4:48 PM

## 2020-07-29 ENCOUNTER — Encounter: Payer: Self-pay | Admitting: Radiology

## 2020-10-14 ENCOUNTER — Other Ambulatory Visit: Payer: Self-pay | Admitting: Family Medicine

## 2020-10-14 DIAGNOSIS — Z01419 Encounter for gynecological examination (general) (routine) without abnormal findings: Secondary | ICD-10-CM

## 2020-10-14 NOTE — Progress Notes (Signed)
Labs ordered prior to office visit

## 2020-10-16 ENCOUNTER — Other Ambulatory Visit: Payer: Self-pay

## 2020-10-16 ENCOUNTER — Other Ambulatory Visit: Payer: 59

## 2020-10-19 LAB — COMPREHENSIVE METABOLIC PANEL
ALT: 15 IU/L (ref 0–32)
AST: 23 IU/L (ref 0–40)
Albumin/Globulin Ratio: 2.2 (ref 1.2–2.2)
Albumin: 4.8 g/dL (ref 3.9–5.0)
Alkaline Phosphatase: 75 IU/L (ref 44–121)
BUN/Creatinine Ratio: 14 (ref 9–23)
BUN: 10 mg/dL (ref 6–20)
Bilirubin Total: 0.2 mg/dL (ref 0.0–1.2)
CO2: 25 mmol/L (ref 20–29)
Calcium: 9.4 mg/dL (ref 8.7–10.2)
Chloride: 104 mmol/L (ref 96–106)
Creatinine, Ser: 0.74 mg/dL (ref 0.57–1.00)
GFR calc Af Amer: 127 mL/min/{1.73_m2} (ref >59)
GFR calc non Af Amer: 111 mL/min/{1.73_m2} (ref >59)
Globulin, Total: 2.2 g/dL (ref 1.5–4.5)
Glucose: 86 mg/dL (ref 65–99)
Potassium: 4.2 mmol/L (ref 3.5–5.2)
Sodium: 142 mmol/L (ref 134–144)
Total Protein: 7 g/dL (ref 6.0–8.5)

## 2020-10-19 LAB — HEMOGLOBIN A1C
Est. average glucose Bld gHb Est-mCnc: 100 mg/dL
Hgb A1c MFr Bld: 5.1 % (ref 4.8–5.6)

## 2020-10-19 LAB — CBC
Hematocrit: 47.5 % — ABNORMAL HIGH (ref 34.0–46.6)
Hemoglobin: 15.5 g/dL (ref 11.1–15.9)
MCH: 28.2 pg (ref 26.6–33.0)
MCHC: 32.6 g/dL (ref 31.5–35.7)
MCV: 87 fL (ref 79–97)
Platelets: 245 10*3/uL (ref 150–450)
RBC: 5.49 x10E6/uL — ABNORMAL HIGH (ref 3.77–5.28)
RDW: 11.7 % (ref 11.7–15.4)
WBC: 8.5 10*3/uL (ref 3.4–10.8)

## 2020-10-19 LAB — T4, FREE: Free T4: 1.47 ng/dL (ref 0.82–1.77)

## 2020-10-19 LAB — TESTT+TESTF+SHBG
Sex Hormone Binding: 132 nmol/L — ABNORMAL HIGH (ref 24.6–122.0)
Testosterone, Free: 2.6 pg/mL (ref 0.0–4.2)
Testosterone, Total, LC/MS: 20.1 ng/dL (ref 10.0–55.0)

## 2020-10-19 LAB — T3, FREE: T3, Free: 3.1 pg/mL (ref 2.0–4.4)

## 2020-10-19 LAB — TSH: TSH: 1.8 u[IU]/mL (ref 0.450–4.500)

## 2020-10-19 LAB — VITAMIN D 25 HYDROXY (VIT D DEFICIENCY, FRACTURES): Vit D, 25-Hydroxy: 45.1 ng/mL (ref 30.0–100.0)

## 2020-10-19 LAB — PROGESTERONE: Progesterone: 0.1 ng/mL

## 2020-10-19 LAB — ESTROGENS, TOTAL: Estrogen: 108 pg/mL

## 2021-02-26 ENCOUNTER — Other Ambulatory Visit: Payer: Self-pay

## 2021-02-26 ENCOUNTER — Encounter (HOSPITAL_BASED_OUTPATIENT_CLINIC_OR_DEPARTMENT_OTHER): Payer: Self-pay | Admitting: Obstetrics and Gynecology

## 2021-02-26 NOTE — Progress Notes (Signed)
Spoke w/ via phone for pre-op interview--- Pt Lab needs dos----  Urine preg (per anes)/  pre-op orders pending             Lab results------ COVID test -----patient states asymptomatic no test needed Arrive at -------  [id number] on 03-03-2021 NPO after MN NO Solid Food.  Clear liquids from MN until--- 0915 Med rec completed Medications to take morning of surgery ----- NONE Diabetic medication ----- n/a Patient instructed no nail polish to be worn day of surgery Patient instructed to bring photo id and insurance card day of surgery Patient aware to have Driver (ride ) / caregiver for 24 hours after surgery -- mother, Kelli Hoffman Patient Special Instructions ----- n/a Pre-Op special Istructions ----- sent inbox message in epic to dr Brien Mates , requested orders Patient verbalized understanding of instructions that were given at this phone interview. Patient denies shortness of breath, chest pain, fever, cough at this phone interview.

## 2021-03-03 ENCOUNTER — Encounter (HOSPITAL_BASED_OUTPATIENT_CLINIC_OR_DEPARTMENT_OTHER)
Admission: RE | Disposition: A | Discharge status: Home / Self Care | Payer: Self-pay | Source: Home / Self Care | Attending: Obstetrics and Gynecology

## 2021-03-03 ENCOUNTER — Ambulatory Visit (HOSPITAL_BASED_OUTPATIENT_CLINIC_OR_DEPARTMENT_OTHER): Payer: 59 | Admitting: Anesthesiology

## 2021-03-03 ENCOUNTER — Ambulatory Visit (HOSPITAL_BASED_OUTPATIENT_CLINIC_OR_DEPARTMENT_OTHER)
Admission: RE | Admit: 2021-03-03 | Discharge: 2021-03-03 | Disposition: A | Discharge status: Home / Self Care | Payer: 59 | Attending: Obstetrics and Gynecology | Admitting: Obstetrics and Gynecology

## 2021-03-03 ENCOUNTER — Other Ambulatory Visit: Payer: Self-pay

## 2021-03-03 ENCOUNTER — Encounter (HOSPITAL_BASED_OUTPATIENT_CLINIC_OR_DEPARTMENT_OTHER): Payer: Self-pay | Admitting: Obstetrics and Gynecology

## 2021-03-03 DIAGNOSIS — Q51810 Arcuate uterus: Secondary | ICD-10-CM | POA: Insufficient documentation

## 2021-03-03 DIAGNOSIS — N920 Excessive and frequent menstruation with regular cycle: Secondary | ICD-10-CM | POA: Diagnosis not present

## 2021-03-03 DIAGNOSIS — D250 Submucous leiomyoma of uterus: Secondary | ICD-10-CM | POA: Diagnosis not present

## 2021-03-03 DIAGNOSIS — Z880 Allergy status to penicillin: Secondary | ICD-10-CM | POA: Insufficient documentation

## 2021-03-03 HISTORY — PX: DILATATION & CURETTAGE/HYSTEROSCOPY WITH MYOSURE: SHX6511

## 2021-03-03 HISTORY — DX: Leiomyoma of uterus, unspecified: D25.9

## 2021-03-03 HISTORY — DX: Abnormal uterine and vaginal bleeding, unspecified: N93.9

## 2021-03-03 LAB — TYPE AND SCREEN
ABO/RH(D): AB NEG
Antibody Screen: NEGATIVE

## 2021-03-03 LAB — POCT PREGNANCY, URINE: Preg Test, Ur: NEGATIVE

## 2021-03-03 LAB — ABO/RH: ABO/RH(D): AB NEG

## 2021-03-03 SURGERY — DILATATION & CURETTAGE/HYSTEROSCOPY WITH MYOSURE
Anesthesia: General | Site: Vagina

## 2021-03-03 MED ORDER — DEXAMETHASONE SODIUM PHOSPHATE 10 MG/ML IJ SOLN
INTRAMUSCULAR | Status: AC
  Filled 2021-03-03: qty 1

## 2021-03-03 MED ORDER — IBUPROFEN 600 MG PO TABS: 600.0000 mg | ORAL_TABLET | Freq: Four times a day (QID) | ORAL | Status: AC | PRN

## 2021-03-03 MED ORDER — LIDOCAINE HCL (PF) 2 % IJ SOLN
INTRAMUSCULAR | Status: AC
  Filled 2021-03-03: qty 5

## 2021-03-03 MED ORDER — MIDAZOLAM HCL 2 MG/2ML IJ SOLN
INTRAMUSCULAR | Status: AC
  Filled 2021-03-03: qty 2

## 2021-03-03 MED ORDER — HYDROMORPHONE HCL 1 MG/ML IJ SOLN: 0.2500 mg | INTRAMUSCULAR | Status: DC | PRN

## 2021-03-03 MED ORDER — MIDAZOLAM HCL 2 MG/2ML IJ SOLN: 0.5000 mg | Freq: Once | INTRAMUSCULAR | Status: DC | PRN

## 2021-03-03 MED ORDER — LACTATED RINGERS IV SOLN: INTRAVENOUS | Status: DC

## 2021-03-03 MED ORDER — PROPOFOL 10 MG/ML IV BOLUS
INTRAVENOUS | Status: DC | PRN
  Administered 2021-03-03: 200 mg via INTRAVENOUS

## 2021-03-03 MED ORDER — KETOROLAC TROMETHAMINE 30 MG/ML IJ SOLN
INTRAMUSCULAR | Status: DC | PRN
  Administered 2021-03-03: 30 mg via INTRAVENOUS

## 2021-03-03 MED ORDER — SODIUM CHLORIDE 0.9 % IR SOLN
Status: DC | PRN
  Administered 2021-03-03: 3000 mL

## 2021-03-03 MED ORDER — ONDANSETRON HCL 4 MG/2ML IJ SOLN
INTRAMUSCULAR | Status: DC | PRN
  Administered 2021-03-03: 4 mg via INTRAVENOUS

## 2021-03-03 MED ORDER — ONDANSETRON HCL 4 MG/2ML IJ SOLN
INTRAMUSCULAR | Status: AC
  Filled 2021-03-03: qty 2

## 2021-03-03 MED ORDER — MIDAZOLAM HCL 5 MG/5ML IJ SOLN
INTRAMUSCULAR | Status: DC | PRN
  Administered 2021-03-03: 2 mg via INTRAVENOUS

## 2021-03-03 MED ORDER — OXYCODONE HCL 5 MG PO TABS: 5.0000 mg | ORAL_TABLET | Freq: Once | ORAL | Status: DC | PRN

## 2021-03-03 MED ORDER — SCOPOLAMINE 1 MG/3DAYS TD PT72
MEDICATED_PATCH | TRANSDERMAL | Status: AC
  Filled 2021-03-03: qty 1

## 2021-03-03 MED ORDER — KETOROLAC TROMETHAMINE 30 MG/ML IJ SOLN
INTRAMUSCULAR | Status: AC
  Filled 2021-03-03: qty 1

## 2021-03-03 MED ORDER — LIDOCAINE HCL 1 % IJ SOLN
INTRAMUSCULAR | Status: DC | PRN
  Administered 2021-03-03: 10 mL

## 2021-03-03 MED ORDER — DEXAMETHASONE SODIUM PHOSPHATE 10 MG/ML IJ SOLN
INTRAMUSCULAR | Status: DC | PRN
  Administered 2021-03-03: 10 mg via INTRAVENOUS

## 2021-03-03 MED ORDER — PROMETHAZINE HCL 25 MG/ML IJ SOLN: 6.2500 mg | INTRAMUSCULAR | Status: DC | PRN

## 2021-03-03 MED ORDER — OXYCODONE HCL 5 MG/5ML PO SOLN: 5.0000 mg | Freq: Once | ORAL | Status: DC | PRN

## 2021-03-03 MED ORDER — SCOPOLAMINE 1 MG/3DAYS TD PT72
1.0000 | MEDICATED_PATCH | TRANSDERMAL | Status: DC
  Administered 2021-03-03: 1.5 mg via TRANSDERMAL

## 2021-03-03 MED ORDER — MEPERIDINE HCL 25 MG/ML IJ SOLN: 6.2500 mg | INTRAMUSCULAR | Status: DC | PRN

## 2021-03-03 MED ORDER — FENTANYL CITRATE (PF) 100 MCG/2ML IJ SOLN
INTRAMUSCULAR | Status: AC
  Filled 2021-03-03: qty 2

## 2021-03-03 MED ORDER — KETOROLAC TROMETHAMINE 15 MG/ML IJ SOLN: 15.0000 mg | INTRAMUSCULAR | Status: DC

## 2021-03-03 MED ORDER — ACETAMINOPHEN 500 MG PO TABS
ORAL_TABLET | ORAL | Status: AC
  Filled 2021-03-03: qty 2

## 2021-03-03 MED ORDER — ACETAMINOPHEN 500 MG PO TABS
1000.0000 mg | ORAL_TABLET | ORAL | Status: AC
  Administered 2021-03-03: 1000 mg via ORAL

## 2021-03-03 MED ORDER — FENTANYL CITRATE (PF) 100 MCG/2ML IJ SOLN
INTRAMUSCULAR | Status: DC | PRN
  Administered 2021-03-03 (×2): 25 ug via INTRAVENOUS
  Administered 2021-03-03: 100 ug via INTRAVENOUS
  Administered 2021-03-03 (×2): 25 ug via INTRAVENOUS

## 2021-03-03 MED ORDER — PROPOFOL 10 MG/ML IV BOLUS
INTRAVENOUS | Status: AC
  Filled 2021-03-03: qty 40

## 2021-03-03 SURGICAL SUPPLY — 15 items
CATH ROBINSON RED A/P 16FR (CATHETERS) ×2 IMPLANT
DEVICE MYOSURE LITE (MISCELLANEOUS) ×2 IMPLANT
GLOVE SURG ENC MOIS LTX SZ6 (GLOVE) ×2 IMPLANT
GLOVE SURG UNDER POLY LF SZ6.5 (GLOVE) ×2 IMPLANT
GLOVE SURG UNDER POLY LF SZ7 (GLOVE) ×4 IMPLANT
GOWN STRL REUS W/ TWL LRG LVL3 (GOWN DISPOSABLE) ×2 IMPLANT
GOWN STRL REUS W/TWL LRG LVL3 (GOWN DISPOSABLE) ×4
IV NS IRRIG 3000ML ARTHROMATIC (IV SOLUTION) ×2 IMPLANT
KIT PROCEDURE FLUENT (KITS) ×2 IMPLANT
KIT TURNOVER CYSTO (KITS) ×2 IMPLANT
PACK VAGINAL MINOR WOMEN LF (CUSTOM PROCEDURE TRAY) ×2 IMPLANT
PAD OB MATERNITY 4.3X12.25 (PERSONAL CARE ITEMS) ×2 IMPLANT
SEAL ROD LENS SCOPE MYOSURE (ABLATOR) ×2 IMPLANT
TOWEL OR 17X26 10 PK STRL BLUE (TOWEL DISPOSABLE) ×2 IMPLANT
UNDERPAD 30X36 HEAVY ABSORB (UNDERPADS AND DIAPERS) ×2 IMPLANT

## 2021-03-03 NOTE — Anesthesia Postprocedure Evaluation (Signed)
Anesthesia Post Note  Patient: Vanisha A Deharo  Procedure(s) Performed: HYSTEROSCOPY WITH REMOVAL OF FIBROID & MYOSURE (Vagina )     Patient location during evaluation: Phase II Anesthesia Type: General Level of consciousness: awake and alert, patient cooperative and oriented Pain management: pain level controlled Vital Signs Assessment: post-procedure vital signs reviewed and stable Respiratory status: spontaneous breathing, nonlabored ventilation and respiratory function stable Cardiovascular status: blood pressure returned to baseline and stable Postop Assessment: no apparent nausea or vomiting, able to ambulate and adequate PO intake Anesthetic complications: no   No notable events documented.  Last Vitals:  Vitals:   03/03/21 1345 03/03/21 1400  BP: 107/61 106/79  Pulse: 61 64  Resp: 10 13  Temp:    SpO2: 96% 100%    Last Pain:  Vitals:   03/03/21 1330  TempSrc:   PainSc: 0-No pain                 Hamdi Kley,E. Juniel Groene

## 2021-03-03 NOTE — Transfer of Care (Signed)
Immediate Anesthesia Transfer of Care Note  Patient: Kelli Hoffman  Procedure(s) Performed: Procedure(s) (LRB): HYSTEROSCOPY WITH REMOVAL OF FIBROID & MYOSURE (N/A)  Patient Location: PACU  Anesthesia Type: General  Level of Consciousness: awake, sedated, patient cooperative and responds to stimulation  Airway & Oxygen Therapy: Patient Spontanous Breathing and Patient connected to Brea 02 and soft FM   Post-op Assessment: Report given to PACU RN, Post -op Vital signs reviewed and stable and Patient moving all extremities  Post vital signs: Reviewed and stable  Complications: No apparent anesthesia complications

## 2021-03-03 NOTE — Op Note (Addendum)
OPERATIVE NOTE  PATIENT:  Kelli Hoffman  30 y.o. female G82 with intermenstrual bleeding, suspected submucosal fibroid   PRE-OPERATIVE DIAGNOSIS:  Intermenstrual bleeding, suspected submucosal fibroid   POST-OPERATIVE DIAGNOSIS:  Submucosal fibroid, arcuate uterus   PROCEDURE: Hysteroscopic myomectomy with Myosura   SURGEON:  Surgeon(s) and Role:    * Rowland Lathe, MD - Primary   ANESTHESIA:   local and MAC   EBL:  10 mL    BLOOD ADMINISTERED:none   DRAINS: none    LOCAL MEDICATIONS USED:  LIDOCAINE    SPECIMEN:  Source of Specimen:  endometrial curettings, possible fibroid   DISPOSITION OF SPECIMEN:  PATHOLOGY   COUNTS:  YES     PLAN OF CARE: Discharge to home after PACU   PATIENT DISPOSITION:  PACU - hemodynamically stable.   FINDINGS: anteverted uterus sounded to 7cm. On hysterscopic view, bilateral ostia visualized with fundal indentation consistent with arcuate uterus. Firm white mound on posterior uterine wall consistent with suspected fibroid seen on imaging. Likely <50% of fibroid was resected.   PROCEDURE IN DETAIL:  The patient was appropriately consented and taken to the operating room where intravenous sedation was administered without difficulty. Thromboguards were placed and connected. She was placed in the dorsal lithotomy position in stirrups. She was examined under anesthesia and found to have an anteverted uterus. The patient was then prepped and draped in normal sterile fashion. A straight catheter was inserted and bladder was emptied.  A speculum was inserted into the vagina. A single-tooth tenaculum was used to grasp the anterior lip of the cervix. Lidocaine 1% was injected at the cervicovaginal fold at the 4 and 8 o'clock positions to obtain a paracervical block. The cervical os was sequentially dilated using Pratt dilators to accommodate the hysteroscope. The hysteroscope was introduced under direct visualization and the uterus was distended with  normal saline. Bilateral ostia were visualized. A fundal indentation was appreciated that was consistent with an arcuate uterus. A firm white mound was noted at the posterior uterine wall that was consistent with the suspected fibroid previously seen on imaging. The Myosure Lite was introduced and used to resect the raised area and surrounding fluffy tissue.  Hemostasis was noted in the uterine cavity. The hysteroscope was removed.  The tenaculum was removed from the cervix and good hemostasis was noted at the puncture sites. Toradol was given in the OR. The patient tolerated the procedure well. The instrument and sponge counts were correct times two. The patient was awakened from anesthesia and taken to the recovery room in stable condition.  MBrien Mates 03/03/21 1:33 PM

## 2021-03-03 NOTE — Anesthesia Procedure Notes (Signed)
Procedure Name: LMA Insertion Date/Time: 03/03/2021 12:56 PM Performed by: Justice Rocher, CRNA Pre-anesthesia Checklist: Timeout performed Patient Re-evaluated:Patient Re-evaluated prior to induction Oxygen Delivery Method: Circle system utilized Preoxygenation: Pre-oxygenation with 100% oxygen Induction Type: IV induction Ventilation: Mask ventilation without difficulty LMA: LMA inserted LMA Size: 4.0 Number of attempts: 1 Airway Equipment and Method: Bite block Placement Confirmation: positive ETCO2, breath sounds checked- equal and bilateral and CO2 detector Tube secured with: Tape Dental Injury: Teeth and Oropharynx as per pre-operative assessment

## 2021-03-03 NOTE — Anesthesia Preprocedure Evaluation (Addendum)
Anesthesia Evaluation  Patient identified by MRN, date of birth, ID band Patient awake    Reviewed: Allergy & Precautions, NPO status , Patient's Chart, lab work & pertinent test results  History of Anesthesia Complications Negative for: history of anesthetic complications  Airway Mallampati: II  TM Distance: >3 FB Neck ROM: Full    Dental  (+) Dental Advisory Given   Pulmonary neg pulmonary ROS,    breath sounds clear to auscultation       Cardiovascular negative cardio ROS   Rhythm:Regular Rate:Normal     Neuro/Psych negative neurological ROS     GI/Hepatic negative GI ROS, Neg liver ROS,   Endo/Other  negative endocrine ROS  Renal/GU negative Renal ROS     Musculoskeletal   Abdominal   Peds  Hematology negative hematology ROS (+)   Anesthesia Other Findings   Reproductive/Obstetrics                            Anesthesia Physical Anesthesia Plan  ASA: 1  Anesthesia Plan: General   Post-op Pain Management:    Induction: Intravenous  PONV Risk Score and Plan: 3 and Ondansetron, Dexamethasone and Scopolamine patch - Pre-op  Airway Management Planned: LMA  Additional Equipment: None  Intra-op Plan:   Post-operative Plan:   Informed Consent: I have reviewed the patients History and Physical, chart, labs and discussed the procedure including the risks, benefits and alternatives for the proposed anesthesia with the patient or authorized representative who has indicated his/her understanding and acceptance.     Dental advisory given  Plan Discussed with: CRNA and Surgeon  Anesthesia Plan Comments:       Anesthesia Quick Evaluation

## 2021-03-03 NOTE — H&P (Addendum)
Kelli Hoffman is an 30 y.o. female G53 with history of daily vaginal spotting for the past 2 years. Tried multiple types of OCPs with previous GYN without any improvement.   12/26/20 in office SIS showed:  6.4cm anteverted uterus with trilaminar 5.30mm EMS and normal appearing ovaries. With saline infusion, 8.2x6.86mm nodule in mid posterior uterine wall with shadowing - most likely demonstrates submucosal fibroid.     Menstrual History: Patient's last menstrual period was 02/21/2021 (exact date).    Past Medical History:  Diagnosis Date   Abnormal uterine bleeding (AUB)    Uterine fibroid     Past Surgical History:  Procedure Laterality Date   NO PAST SURGERIES     WISDOM TOOTH EXTRACTION      Family History  Problem Relation Age of Onset   Diabetes Other    Heart disease Mother    Heart disease Father    Cancer Maternal Grandmother        lung, smoker    Social History:  reports that she has never smoked. She has never used smokeless tobacco. She reports previous alcohol use. She reports that she does not use drugs.  Allergies:  Allergies  Allergen Reactions   Penicillins     Unknown childhood reaction    Medications Prior to Admission  Medication Sig Dispense Refill Last Dose   Multiple Vitamin (MULTIVITAMIN) tablet Take 1 tablet by mouth daily.   03/02/2021 at 2000   Norethindrone Acetate-Ethinyl Estrad-FE (LOESTRIN 24 FE) 1-20 MG-MCG(24) tablet Take 1 tablet by mouth daily. 28 tablet 11 More than a month    Review of Systems  Blood pressure 123/79, pulse 87, temperature 98.8 F (37.1 C), temperature source Oral, resp. rate 14, height 5\' 5"  (1.651 m), weight 61 kg, last menstrual period 02/21/2021, SpO2 99 %. Physical Exam Constitutional:      Appearance: Normal appearance. She is normal weight.  HENT:     Head: Normocephalic.  Cardiovascular:     Rate and Rhythm: Normal rate.  Pulmonary:     Effort: Pulmonary effort is normal.  Abdominal:     General:  Abdomen is flat.     Tenderness: There is no abdominal tenderness.  Neurological:     Mental Status: She is alert.  Psychiatric:        Attention and Perception: Attention and perception normal.        Mood and Affect: Mood and affect normal.     Results for orders placed or performed during the hospital encounter of 03/03/21 (from the past 24 hour(s))  Pregnancy, urine POC     Status: None   Collection Time: 03/03/21 10:20 AM  Result Value Ref Range   Preg Test, Ur NEGATIVE NEGATIVE    No results found.  Assessment/Plan: 20Y G0 with intermenstrual vaginal bleeding, suspected submucosal fibroid, here for operative hysteroscopy, myomectomy with Myosure. - Informed consent discussed.  Reviewed risks of procedure including infection, bleeding, uterine perforation. We discussed possibility that procedure may not cure her intermenstrual bleeding, that fibroid could recur, that second procedure could be required to completely remove fibroid. Consent signed - Discussed postop instructions - NPO - SCDs - No antibiotics indicated  Rowland Lathe 03/03/2021, 11:59 AM

## 2021-03-03 NOTE — Brief Op Note (Signed)
03/03/2021  1:24 PM  PATIENT:  Kelli Hoffman  30 y.o. female G77 with intermenstrual bleeding, suspected submucosal fibroid  PRE-OPERATIVE DIAGNOSIS:  Intermenstrual bleeding, suspected submucosal fibroid  POST-OPERATIVE DIAGNOSIS:  Submucosal fibroid, arcuate uterus  PROCEDURE: Hysteroscopic myomectomy with Myosura  SURGEON:  Surgeon(s) and Role:    * Rowland Lathe, MD - Primary  ANESTHESIA:   local and MAC  EBL:  10 mL   BLOOD ADMINISTERED:none  DRAINS: none   LOCAL MEDICATIONS USED:  LIDOCAINE   SPECIMEN:  Source of Specimen:  endometrial curettings, possible fibroid  DISPOSITION OF SPECIMEN:  PATHOLOGY  COUNTS:  YES  TOURNIQUET:  * No tourniquets in log *  DICTATION: .Note written in EPIC  PLAN OF CARE: Discharge to home after PACU  PATIENT DISPOSITION:  PACU - hemodynamically stable.   Delay start of Pharmacological VTE agent (>24hrs) due to surgical blood loss or risk of bleeding: not applicable

## 2021-03-03 NOTE — Discharge Instructions (Signed)
NO IBUPROFEN, MOTRIN, ADVIL, ALEVE, OR NAPROXEN UNTIL AFTER 7:15 PM TONIGHT.    Post Anesthesia Home Care Instructions  Activity: Get plenty of rest for the remainder of the day. A responsible individual must stay with you for 24 hours following the procedure.  For the next 24 hours, DO NOT: -Drive a car -Paediatric nurse -Drink alcoholic beverages -Take any medication unless instructed by your physician -Make any legal decisions or sign important papers.  Meals: Start with liquid foods such as gelatin or soup. Progress to regular foods as tolerated. Avoid greasy, spicy, heavy foods. If nausea and/or vomiting occur, drink only clear liquids until the nausea and/or vomiting subsides. Call your physician if vomiting continues.  Special Instructions/Symptoms: Your throat may feel dry or sore from the anesthesia or the breathing tube placed in your throat during surgery. If this causes discomfort, gargle with warm salt water. The discomfort should disappear within 24 hours.  If you had a scopolamine patch placed behind your ear for the management of post- operative nausea and/or vomiting:  1. The medication in the patch is effective for 72 hours, after which it should be removed.  Wrap patch in a tissue and discard in the trash. Wash hands thoroughly with soap and water. 2. You may remove the patch earlier than 72 hours if you experience unpleasant side effects which may include dry mouth, dizziness or visual disturbances. 3. Avoid touching the patch. Wash your hands with soap and water after contact with the patch.

## 2021-03-04 ENCOUNTER — Encounter (HOSPITAL_BASED_OUTPATIENT_CLINIC_OR_DEPARTMENT_OTHER): Payer: Self-pay | Admitting: Obstetrics and Gynecology

## 2021-03-04 LAB — SURGICAL PATHOLOGY

## 2022-04-12 ENCOUNTER — Ambulatory Visit
Admission: RE | Admit: 2022-04-12 | Discharge: 2022-04-12 | Disposition: A | Discharge status: Home / Self Care | Payer: Commercial Managed Care - PPO | Source: Ambulatory Visit | Attending: Emergency Medicine | Admitting: Emergency Medicine

## 2022-04-12 VITALS — BP 136/82 | HR 88 | Temp 98.7°F | Resp 12

## 2022-04-12 DIAGNOSIS — H1133 Conjunctival hemorrhage, bilateral: Secondary | ICD-10-CM

## 2022-04-12 NOTE — ED Provider Notes (Signed)
Roderic Palau    CSN: [id number] Arrival date & time: 04/12/22  1744      History   Chief Complaint Chief Complaint  Patient presents with   Eye Problem    HPI Kelli Hoffman is a 31 y.o. female.  Patient presents with bilateral eye redness x2 days.  She went to [place] to celebrate her birthday.  She vomited twice one night while there and notice the next morning that her eyes were red.  She denies injury, eye pain, changes in her vision, eye drainage, fever, chills, sore throat, cough, shortness of breath, diarrhea, or other symptoms.  Treatment at home with OTC eyedrops.  The history is provided by the patient.    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 Authorizing Provider  ibuprofen (ADVIL) 600 MG tablet Take 1 tablet (600 mg total) by mouth every 6 (six) hours as needed for cramping. 03/03/21   Rowland Lathe, MD  Multiple Vitamin (MULTIVITAMIN) tablet Take 1 tablet by mouth daily.    [provider]  Norethindrone Acetate-Ethinyl  Estrad-FE (LOESTRIN 24 FE) 1-20 MG-MCG(24) tablet Take 1 tablet by mouth daily. 07/17/20   Donnamae Jude, MD    Family History Family History  Problem Relation Age of Onset   Diabetes Other    Heart disease Mother    Heart disease Father    Cancer Maternal Grandmother        lung, smoker    Social History Social History   Tobacco Use   Smoking status: Never   Smokeless tobacco: Never  Vaping Use   Vaping Use: Never used  Substance Use Topics   Alcohol use: Not Currently    Comment: rare   Drug use: No     Allergies   Penicillins   Review of Systems Review of Systems  Constitutional:  Negative for chills and fever.  HENT:  Negative for ear pain and sore throat.   Eyes:  Positive for redness. Negative for pain, discharge, itching and visual disturbance.  Respiratory:  Negative for cough and shortness of breath.   Gastrointestinal:  Positive for vomiting. Negative for abdominal pain and diarrhea.  All other systems reviewed and are negative.    Physical Exam Triage Vital Signs ED Triage Vitals  Enc Vitals Group  BP 04/12/22 1754 136/82     Pulse Rate 04/12/22 1754 88     Resp 04/12/22 1754 12     Temp 04/12/22 1754 98.7 F (37.1 C)     Temp Source 04/12/22 1754 Oral     SpO2 04/12/22 1754 97 %     Weight --      Height --      Head Circumference --      Peak Flow --      Pain Score 04/12/22 1757 0     Pain Loc --      Pain Edu? --      Excl. in [place]? --    No data found.  Updated Vital Signs BP 136/82 (BP Location: Left Arm)   Pulse 88   Temp 98.7 F (37.1 C) (Oral)   Resp 12   LMP 03/18/2022 (Exact Date)   SpO2 97%   Visual Acuity Right Eye Distance: 20/30 Left Eye Distance: 20/30 Bilateral Distance: 20/30  Right Eye Near:   Left Eye Near:    Bilateral Near:     Physical Exam Vitals and nursing note reviewed.  Constitutional:      General: She is not in acute distress.    Appearance: Normal appearance. She is well-developed. She  is not ill-appearing.  HENT:     Right Ear: Tympanic membrane normal.     Left Ear: Tympanic membrane normal.     Nose: Nose normal.     Mouth/Throat:     Mouth: Mucous membranes are moist.     Pharynx: Oropharynx is clear.  Eyes:     General: Lids are normal. Vision grossly intact.        Right eye: No discharge.        Left eye: No discharge.     Extraocular Movements: Extraocular movements intact.     Pupils: Pupils are equal, round, and reactive to light.     Comments: Conjunctival hemorrhages in the lower half of both eyes.  No drainage.  Cardiovascular:     Rate and Rhythm: Normal rate and regular rhythm.     Heart sounds: Normal heart sounds.  Pulmonary:     Effort: Pulmonary effort is normal. No respiratory distress.     Breath sounds: Normal breath sounds.  Musculoskeletal:     Cervical back: Neck supple.  Skin:    General: Skin is warm and dry.  Neurological:     Mental Status: She is alert.  Psychiatric:        Mood and Affect: Mood normal.        Behavior: Behavior normal.      UC Treatments / Results  Labs (all labs ordered are listed, but only abnormal results are displayed) Labs Reviewed - No data to display  EKG   Radiology No results found.  Procedures Procedures (including critical care time)  Medications Ordered in UC Medications - No data to display  Initial Impression / Assessment and Plan / UC Course  I have reviewed the triage vital signs and the nursing notes.  Pertinent labs & imaging results that were available during my care of the patient were reviewed by me and considered in my medical decision making (see chart for details).  Conjunctival hemorrhage of both eyes.  Education provided on subconjunctival hemorrhages.  Instructed patient to stop using OTC anti-redness eyedrops; suggested saline drops instead.  Instructed her to follow-up with her PCP or eye care provider if her symptoms are not improving.  ED  precautions discussed.   Patient agrees to plan of care.   Final Clinical Impressions(s) / UC Diagnoses   Final diagnoses:  Conjunctival hemorrhage of both eyes     Discharge Instructions      See the attached information on conjunctival hemorrhages.    Use saline eyedrops as needed.  Follow-up with your primary care provider if your symptoms are not improving.    Go to the emergency department if you have acute eye pain, changes in your vision, or other concerning symptoms.        ED Prescriptions   None    PDMP not reviewed this encounter.   Sharion Balloon, NP 04/12/22 1825

## 2022-04-12 NOTE — ED Triage Notes (Signed)
Patient c/o bilateral eye vessel "popped" x 2 days.   Patient endorses onset of symptoms began after "throwing up".  Patient denies pain. Patient denies headache. Patient denies blurry vision.   Patient has used Clear eyes with some relief of symptoms.

## 2022-04-12 NOTE — Discharge Instructions (Addendum)
See the attached information on conjunctival hemorrhages.    Use saline eyedrops as needed.  Follow-up with your primary care provider if your symptoms are not improving.    Go to the emergency department if you have acute eye pain, changes in your vision, or other concerning symptoms.

## 2022-04-20 ENCOUNTER — Ambulatory Visit
Admission: RE | Admit: 2022-04-20 | Discharge: 2022-04-20 | Disposition: A | Discharge status: Home / Self Care | Payer: Commercial Managed Care - PPO | Source: Ambulatory Visit | Attending: Emergency Medicine | Admitting: Emergency Medicine

## 2022-04-20 ENCOUNTER — Other Ambulatory Visit: Payer: Self-pay

## 2022-04-20 VITALS — BP 126/85 | HR 79 | Temp 98.4°F | Resp 16

## 2022-04-20 DIAGNOSIS — T63441A Toxic effect of venom of bees, accidental (unintentional), initial encounter: Secondary | ICD-10-CM | POA: Diagnosis not present

## 2022-04-20 MED ORDER — PREDNISONE 10 MG (21) PO TBPK: ORAL_TABLET | Freq: Every day | ORAL | 0 refills | Status: AC

## 2022-04-20 NOTE — ED Provider Notes (Signed)
Kelli Hoffman    CSN: [id number] Arrival date & time: 04/20/22  1807      History   Chief Complaint Chief Complaint  Patient presents with   Insect Bite    Bee sting. Allergic to them. - Entered by patient    HPI Kelli Hoffman is a 31 y.o. female.   Patient presents with erythema, swelling and tenderness to the left upper extremity beginning 2 day ago approximately around 5 PM.  Endorses that she swatted at a bee and was stung.  Known allergic reaction.  Symptoms started shortly after, swelling and erythema has worsened, patient has been marking the expansion. Has been using Benadryl and ice which have been minimally helpful.  Denies shortness of breath, wheezing, itchy throat, swollen throat, sore throat, fever, chills, drainage.  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 Authorizing Provider  predniSONE (STERAPRED UNI-PAK 21 TAB) 10 MG (21) TBPK tablet Take by mouth daily. Take 6 tabs by mouth daily  for 2 days, then 5 tabs for 2 days, then 4 tabs for 2 days,  then 3 tabs for 2 days, 2 tabs for 2 days, then 1 tab by mouth daily for 2 days 04/20/22  Yes Vong Garringer, Kelli Liberty R, NP  ibuprofen (ADVIL) 600 MG tablet Take 1 tablet (600 mg total) by mouth every 6 (six) hours as needed for cramping. 03/03/21   Rowland Lathe, MD  Multiple Vitamin (MULTIVITAMIN) tablet Take 1 tablet by mouth daily.    [provider]  Norethindrone Acetate-Ethinyl Estrad-FE (LOESTRIN 24 FE) 1-20 MG-MCG(24) tablet Take 1 tablet by mouth daily. Patient not taking: Reported on 04/20/2022 07/17/20   Donnamae Jude, MD    Family History Family History  Problem Relation Age of Onset   Heart disease Mother    Heart disease Father    Cancer Maternal Grandmother        lung, smoker   Diabetes Other     Social History Social History   Tobacco Use   Smoking status: Never   Smokeless tobacco: Never  Vaping Use   Vaping Use: Never used  Substance Use Topics   Alcohol use: Not Currently    Comment: rare   Drug use: No     Allergies   Penicillins   Review  of Systems Review of Systems  Constitutional: Negative.   Respiratory: Negative.    Cardiovascular: Negative.   Skin:  Positive for rash. Negative for color change, pallor and wound.  Neurological: Negative.      Physical Exam Triage Vital Signs ED Triage Vitals  Enc Vitals Group     BP 04/20/22 1821 126/85     Pulse Rate 04/20/22 1821 79     Resp 04/20/22 1821 16     Temp 04/20/22 1821 98.4 F (36.9 C)     Temp Source 04/20/22 1821 Oral     SpO2 04/20/22 1821 98 %     Weight --      Height --      Head Circumference --      Peak Flow --      Pain Score 04/20/22 1819 0     Pain Loc --      Pain Edu? --      Excl. in [place]? --    No data found.  Updated Vital Signs BP 126/85 (BP Location: Left Arm)   Pulse 79   Temp 98.4 F (36.9 C) (Oral)   Resp 16   LMP 04/16/2022 (Exact Date)   SpO2 98%   Visual Acuity Right Eye Distance:   Left Eye Distance:   Bilateral Distance:    Right Eye  Near:   Left Eye Near:    Bilateral Near:     Physical Exam Constitutional:      Appearance: Normal appearance.  HENT:     Head: Normocephalic.  Eyes:     Extraocular Movements: Extraocular movements intact.  Skin:    Comments: Approximately 4 x 6 circular erythematous papule present to the anterior of the left lower extremity, tender to palpation, nondraining  Neurological:     Mental Status: She is oriented to person, place, and time. Mental status is at baseline.  Psychiatric:        Mood and Affect: Mood normal.        Behavior: Behavior normal.      UC Treatments / Results  Labs (all labs ordered are listed, but only abnormal results are displayed) Labs Reviewed - No data to display  EKG   Radiology No results found.  Procedures Procedures (including critical care time)  Medications Ordered in UC Medications - No data to display  Initial Impression / Assessment and Plan / UC Course  I have reviewed the triage vital signs and the nursing notes.  Pertinent labs & imaging results that were available during my care of the patient were reviewed by me and considered in my medical decision making (see chart for details).  Bee sting reaction, accidental or unintentional, initial encounter  Reaction is localized but has been worsening based on patient markings, prednisone 60 mg taper prescribed, may continue use oral antihistamines if helping with pruritus, advised avoidance of long exposure to heat to prevent further irritation and given strict precautions for persisting or worsening site to follow-up with urgent care as needed, given strict precautions that at any point if she has difficulty breathing she is to go to the nearest emergency department for further evaluation and management Final Clinical Impressions(s) / UC Diagnoses   Final diagnoses:  Bee sting reaction, accidental or unintentional, initial encounter     Discharge Instructions      Today you are  being treated for a localized reaction to a bee sting  Starting tomorrow take prednisone every morning with food as directed, to continue  the above process  You may continue use of topical calamine or Benadryl cream to help manage itching, you may also continue oral Benadryl  Please avoid long exposures to heat such as a hot steamy shower or being outside as this may cause further irritation to your rash  You may follow-up with his urgent care as needed if symptoms persist or worsen    ED Prescriptions     Medication Sig Dispense Auth. Provider   predniSONE (STERAPRED UNI-PAK 21 TAB) 10 MG (21) TBPK tablet Take by mouth daily. Take 6 tabs by mouth daily  for 2 days, then 5 tabs for 2 days, then 4 tabs for 2 days, then 3 tabs for 2 days, 2 tabs for 2 days, then 1 tab by mouth daily for 2 days 42 tablet Aimar Sh[place], Leitha Schuller, NP      PDMP not reviewed this encounter.   Hans Eden, NP 04/20/22 1840

## 2022-04-20 NOTE — ED Triage Notes (Addendum)
Patient was stung on sunday evening around 5:00 pm.  Patient has been taking benadryl.  Patient reports when she swatted at insect, she did get stinger .  Anterior left thigh with redness, swelling that dies itch, no pain.  Patient has been marking the expansion of redness around the bite  Has used benadryl and ice

## 2022-04-20 NOTE — Discharge Instructions (Signed)
Today you are being treated for a localized reaction to a bee sting  Starting tomorrow take prednisone every morning with food as directed, to continue the above process  You may continue use of topical calamine or Benadryl cream to help manage itching, you may also continue oral Benadryl  Please avoid long exposures to heat such as a hot steamy shower or being outside as this may cause further irritation to your rash  You may follow-up with his urgent care as needed if symptoms persist or worsen

## 2022-09-14 ENCOUNTER — Ambulatory Visit: Payer: Self-pay
